# Patient Record
Sex: Female | Born: 1970
Health system: Southern US, Community
[De-identification: ages and names within clinical notes are randomized; demographics above are authoritative.]

## PROBLEM LIST (undated history)

## (undated) DIAGNOSIS — J45909 Unspecified asthma, uncomplicated: Secondary | ICD-10-CM

## (undated) DIAGNOSIS — I493 Ventricular premature depolarization: Secondary | ICD-10-CM

## (undated) DIAGNOSIS — G4733 Obstructive sleep apnea (adult) (pediatric): Principal | ICD-10-CM

## (undated) HISTORY — DX: Unspecified asthma, uncomplicated: J45.909

## (undated) HISTORY — DX: Obstructive sleep apnea (adult) (pediatric): G47.33

## (undated) HISTORY — DX: Ventricular premature depolarization: I49.3

---

## 2015-10-11 ENCOUNTER — Encounter: Payer: Self-pay | Admitting: Physician Assistant

## 2016-02-07 DIAGNOSIS — J45909 Unspecified asthma, uncomplicated: Secondary | ICD-10-CM | POA: Diagnosis not present

## 2016-07-17 ENCOUNTER — Ambulatory Visit (INDEPENDENT_AMBULATORY_CARE_PROVIDER_SITE_OTHER): Payer: BLUE CROSS/BLUE SHIELD | Admitting: Physician Assistant

## 2016-07-17 ENCOUNTER — Encounter: Payer: Self-pay | Admitting: Physician Assistant

## 2016-07-17 VITALS — BP 118/84 | HR 82 | Temp 97.9°F | Ht 62.0 in | Wt 290.0 lb

## 2016-07-17 DIAGNOSIS — M722 Plantar fascial fibromatosis: Secondary | ICD-10-CM | POA: Diagnosis not present

## 2016-07-17 DIAGNOSIS — J4530 Mild persistent asthma, uncomplicated: Secondary | ICD-10-CM

## 2016-07-17 DIAGNOSIS — E559 Vitamin D deficiency, unspecified: Secondary | ICD-10-CM | POA: Insufficient documentation

## 2016-07-17 DIAGNOSIS — L309 Dermatitis, unspecified: Secondary | ICD-10-CM | POA: Diagnosis not present

## 2016-07-17 DIAGNOSIS — IMO0001 Reserved for inherently not codable concepts without codable children: Secondary | ICD-10-CM | POA: Insufficient documentation

## 2016-07-17 DIAGNOSIS — M778 Other enthesopathies, not elsewhere classified: Secondary | ICD-10-CM

## 2016-07-17 DIAGNOSIS — D239 Other benign neoplasm of skin, unspecified: Secondary | ICD-10-CM

## 2016-07-17 DIAGNOSIS — D229 Melanocytic nevi, unspecified: Secondary | ICD-10-CM

## 2016-07-17 DIAGNOSIS — Z309 Encounter for contraceptive management, unspecified: Secondary | ICD-10-CM | POA: Diagnosis not present

## 2016-07-17 DIAGNOSIS — J45909 Unspecified asthma, uncomplicated: Secondary | ICD-10-CM | POA: Insufficient documentation

## 2016-07-17 DIAGNOSIS — Z6838 Body mass index (BMI) 38.0-38.9, adult: Secondary | ICD-10-CM | POA: Diagnosis not present

## 2016-07-17 DIAGNOSIS — M659 Synovitis and tenosynovitis, unspecified: Secondary | ICD-10-CM

## 2016-07-17 NOTE — Patient Instructions (Signed)
Asthma Attack Prevention While you may not be able to control the fact that you have asthma, you can take actions to prevent asthma attacks. The best way to prevent asthma attacks is to maintain good control of your asthma. You can achieve this by:  Taking your medicines as directed.  Avoiding things that can irritate your airways or make your asthma symptoms worse (asthma triggers).  Keeping track of how well your asthma is controlled and of any changes in your symptoms.  Responding quickly to worsening asthma symptoms (asthma attack).  Seeking emergency care when it is needed. WHAT ARE SOME WAYS TO PREVENT AN ASTHMA ATTACK? Have a Plan Work with your health care provider to create a written plan for managing and treating your asthma attacks (asthma action plan). This plan includes:  A list of your asthma triggers and how you can avoid them.  Information on when medicines should be taken and when their dosages should be changed.  The use of a device that measures how well your lungs are working (peak flow meter). Monitor Your Asthma Use your peak flow meter and record your results in a journal every day. A drop in your peak flow numbers on one or more days may indicate the start of an asthma attack. This can happen even before you start to feel symptoms. You can prevent an asthma attack from getting worse by following the steps in your asthma action plan. Avoid Asthma Triggers Work with your asthma health care provider to find out what your asthma triggers are. This can be done by:  Allergy testing.  Keeping a journal that notes when asthma attacks occur and the factors that may have contributed to them.  Determining if there are other medical conditions that are making your asthma worse. Once you have determined your asthma triggers, take steps to avoid them. This may include avoiding excessive or prolonged exposure to:  Dust. Have someone dust and vacuum your home for you once or  twice a week. Using a high-efficiency particulate arrestance (HEPA) vacuum is best.  Smoke. This includes campfire smoke, forest fire smoke, and secondhand smoke from tobacco products.  Pet dander. Avoid contact with animals that you know you are allergic to.  Allergens from trees, grasses or pollens. Avoid spending a lot of time outdoors when pollen counts are high, and on very windy days.  Very cold, dry, or humid air.  Mold.  Foods that contain high amounts of sulfites.  Strong odors.  Outdoor air pollutants, such as engine exhaust.  Indoor air pollutants, such as aerosol sprays and fumes from household cleaners.  Household pests, including dust mites and cockroaches, and pest droppings.  Certain medicines, including NSAIDs. Always talk to your health care provider before stopping or starting any new medicines. Medicines Take over-the-counter and prescription medicines only as told by your health care provider. Many asthma attacks can be prevented by carefully following your medicine schedule. Taking your medicines correctly is especially important when you cannot avoid certain asthma triggers. Act Quickly If an asthma attack does happen, acting quickly can decrease how severe it is and how long it lasts. Take these steps:   Pay attention to your symptoms. If you are coughing, wheezing, or having difficulty breathing, do not wait to see if your symptoms go away on their own. Follow your asthma action plan.  If you have followed your asthma action plan and your symptoms are not improving, call your health care provider or seek immediate medical care   at the nearest hospital. It is important to note how often you need to use your fast-acting rescue inhaler. If you are using your rescue inhaler more often, it may mean that your asthma is not under control. Adjusting your asthma treatment plan may help you to prevent future asthma attacks and help you to gain better control of your  condition. HOW CAN I PREVENT AN ASTHMA ATTACK WHEN I EXERCISE? Follow advice from your health care provider about whether you should use your fast-acting inhaler before exercising. Many people with asthma experience exercise-induced bronchoconstriction (EIB). This condition often worsens during vigorous exercise in cold, humid, or dry environments. Usually, people with EIB can stay very active by pre-treating with a fast-acting inhaler before exercising.   This information is not intended to replace advice given to you by your health care provider. Make sure you discuss any questions you have with your health care provider.   Document Released: 10/11/2009 Document Revised: 07/14/2015 Document Reviewed: 03/25/2015 Elsevier Interactive Patient Education 2016 Elsevier Inc.  

## 2016-07-17 NOTE — Progress Notes (Signed)
BP 118/84 (BP Location: Right Arm, Patient Position: Sitting, Cuff Size: Large)   Pulse 82   Temp 97.9 F (36.6 C) (Oral)   Wt 209 lb 9.6 oz (95.1 kg)   LMP 05/16/2016    Subjective:    Patient ID: Barbara Hinton, female    DOB: [birth year], 45 y.o.   MRN: [id number]  Barbara Hinton is a 45 y.o. female presenting on 07/17/2016 for Amenorrhea (Missed menstrual cycle the last 2 months. Took 2 home pregnancy test and they were negative ); Elbow Pain (right ); sore on nose; and Foot Swelling (heels and the side of her feet hurt )  HPI Patient here to be established as new patient at [facility].  This patient is known to me from [facility]. This patient has multiple medical complaints today. She also has multiple medical conditions that are reviewed today and updated in her chart. All of her medications are reviewed today also. She is most concerned about having 2 cycles that have been missed. When she missed 1 in July she did not take her birth control starting the next month. She should've had a cycle last week and did not have one. She has taken 2 home test and is negative. She states occasionally she has some lower right abdominal pain but no changes in her bowel bladder or urinary function. She is not had sexual intercourse since July. She is having night time sweats and disrupted sleep. Her mother was in her mid 63s when she went through menopause. She was placed on the birth control to control the ovarian cyst. And this has done well over the past 2 years. I've encouraged her to get back on this at this time to control cyst.  She has a lesion on her nose that started early summer and had a hard time healing. It is only just been in recent week or so that it has completely cleared. She does have a family history of skin cancer. She is concerned about this lesion and we will have dermatology evaluation of it.  She is also having a significant amount of  tenderness in the right medial epicondyle. 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 She has not been taking any anti-inflammatory for this.  She has a recurrence of her plantar fasciitis. We have discussed wearing structured shoes as much as possible. We've also discussed wearing as good issues as possible when she is at work. She does stand a lot at her job. Again she has not been taking any anti-inflammatory and will resume this. We have also discussed stretching both feet when possible. Next  All of her conditions and medications are reviewed today and we will send refills if needed.   Relevant past medical, surgical, family and social history reviewed and updated as indicated. Interim medical history since our last visit reviewed. Allergies and medications reviewed and updated.   Data reviewed from any sources in EPIC.  Review of Systems  Constitutional: Negative.  Negative for activity change, fatigue and fever.  HENT: Negative.   Eyes: Negative.   Respiratory: Negative.  Negative for cough.   Cardiovascular: Negative.  Negative for chest pain.  Gastrointestinal: Negative.  Negative for abdominal pain.  Endocrine: Negative.   Genitourinary: Positive for urgency. Negative for dysuria.  Musculoskeletal: Positive for arthralgias and joint swelling.  Skin: Negative.   Neurological: Negative.   Psychiatric/Behavioral: Negative.  Per HPI unless specifically indicated above  Social History   Social History  . Marital status: Married    Spouse name: N/A  . Number of children: N/A  . Years of education: N/A   Occupational History  . Not on file.   Social History Main Topics  . Smoking status: Never Smoker  . Smokeless tobacco: Never Used  . Alcohol use No  . Drug use: No  . Sexual activity: Not on file   Other Topics Concern  . Not on file   Social History Narrative  . No narrative on file    History reviewed. No pertinent  surgical history.  Family History  Problem Relation Age of Onset  . Diabetes Mother   . Diabetes Father   . Hypertension Father   . Hypertension Brother       Medication List       Accurate as of 07/17/16 10:29 AM. Always use your most recent med list.          albuterol 108 (90 Base) MCG/ACT inhaler Commonly known as:  PROVENTIL HFA;VENTOLIN HFA Inhale 2 puffs into the lungs every 6 (six) hours as needed for wheezing or shortness of breath.   diazepam 10 MG tablet Commonly known as:  VALIUM Take 10 mg by mouth every 6 (six) hours as needed for anxiety.   Fluticasone-Salmeterol 100-50 MCG/DOSE Aepb Commonly known as:  ADVAIR Inhale 1 puff into the lungs 2 (two) times daily.   montelukast 10 MG tablet Commonly known as:  SINGULAIR Take 10 mg by mouth at bedtime.   norethindrone-ethinyl estradiol 1/35 tablet Commonly known as:  ORTHO-NOVUM, NORTREL,CYCLAFEM Take 1 tablet by mouth daily.   Vitamin D (Ergocalciferol) 50000 units Caps capsule Commonly known as:  DRISDOL Take 50,000 Units by mouth every 7 (seven) days.          Objective:    BP 118/84 (BP Location: Right Arm, Patient Position: Sitting, Cuff Size: Large)   Pulse 82   Temp 97.9 F (36.6 C) (Oral)   Wt 209 lb 9.6 oz (95.1 kg)   LMP 05/16/2016   No Known Allergies Wt Readings from Last 3 Encounters:  07/17/16 209 lb 9.6 oz (95.1 kg)    Physical Exam  Constitutional: She is oriented to person, place, and time. She appears well-developed and well-nourished.  HENT:  Head: Normocephalic and atraumatic.  Eyes: Conjunctivae and EOM are normal. Pupils are equal, round, and reactive to light.  Neck: Normal range of motion. Neck supple.  Cardiovascular: Normal rate, regular rhythm, normal heart sounds and intact distal pulses.   Pulmonary/Chest: Effort normal and breath sounds normal.  Abdominal: Soft. Bowel sounds are normal.  Musculoskeletal:       Right elbow: She exhibits normal range of motion,  no swelling and no deformity. Tenderness found. Medial epicondyle tenderness noted.       Arms:      Right foot: There is decreased range of motion, tenderness and bony tenderness.       Left foot: There is tenderness and bony tenderness.       Feet:  Neurological: She is alert and oriented to person, place, and time. She has normal reflexes.  Skin: Skin is warm and dry. Lesion and rash noted. Rash is papular.     Raised pink edge with center umbilicus.  Present for 3 months. similar very small lesion on the right forehead.  Psychiatric: She has a normal mood and affect. Her behavior is normal. Judgment and thought  content normal.  Nursing note and vitals reviewed.   No results found for this or any previous visit.    Assessment & Plan:   1. Asthma, mild persistent, uncomplicated continue regular meds  2. Encounter for contraceptive management, unspecified encounter Restart birth control today in light of 2 neg pregnancy tests and history of ovarian cysts  3. Vitamin D deficiency Continue med  4. Eczematous dermatitis Continue med  5. Atypical nevus History of poor healing lesion on nose, raised pearly border - Ambulatory referral to Dermatology Dr. Marylou Mccoy, [place]  6. Plantar fasciitis Continue meds and stretching  7. Elbow tendonitis Continue meds and ice  Continue all other maintenance medications as listed above.  Follow up plan: Return in about 3 months (around 10/16/2016).  Terald Sleeper PA-C [facility] [address]  [place], [place] [address] [phone]   07/17/2016, 10:29 AM

## 2016-08-15 DIAGNOSIS — C44319 Basal cell carcinoma of skin of other parts of face: Secondary | ICD-10-CM | POA: Diagnosis not present

## 2016-08-15 DIAGNOSIS — L309 Dermatitis, unspecified: Secondary | ICD-10-CM | POA: Diagnosis not present

## 2016-08-15 DIAGNOSIS — C44311 Basal cell carcinoma of skin of nose: Secondary | ICD-10-CM | POA: Diagnosis not present

## 2016-10-03 ENCOUNTER — Other Ambulatory Visit: Payer: Self-pay | Admitting: Physician Assistant

## 2016-10-04 ENCOUNTER — Other Ambulatory Visit: Payer: Self-pay | Admitting: *Deleted

## 2016-10-10 ENCOUNTER — Telehealth: Payer: Self-pay | Admitting: *Deleted

## 2016-10-10 MED ORDER — PREDNISONE 10 MG (21) PO TBPK: ORAL_TABLET | ORAL | 0 refills | Status: DC

## 2016-10-10 MED ORDER — LEVOFLOXACIN 500 MG PO TABS: 500.0000 mg | ORAL_TABLET | Freq: Every day | ORAL | 0 refills | Status: DC

## 2016-10-10 MED ORDER — HYDROCODONE-HOMATROPINE 5-1.5 MG/5ML PO SYRP: 5.0000 mL | ORAL_SOLUTION | Freq: Four times a day (QID) | ORAL | 0 refills | Status: DC | PRN

## 2016-10-10 NOTE — Telephone Encounter (Signed)
sent 

## 2016-10-10 NOTE — Telephone Encounter (Signed)
Patient aware.

## 2016-10-10 NOTE — Telephone Encounter (Signed)
Patient has a cough with a lot of congestion. She has been using her nebulizer.

## 2016-11-12 DIAGNOSIS — R0789 Other chest pain: Secondary | ICD-10-CM | POA: Diagnosis not present

## 2016-11-12 DIAGNOSIS — I499 Cardiac arrhythmia, unspecified: Secondary | ICD-10-CM | POA: Diagnosis not present

## 2016-11-13 ENCOUNTER — Ambulatory Visit (INDEPENDENT_AMBULATORY_CARE_PROVIDER_SITE_OTHER): Payer: BLUE CROSS/BLUE SHIELD | Admitting: Physician Assistant

## 2016-11-13 ENCOUNTER — Encounter: Payer: Self-pay | Admitting: Physician Assistant

## 2016-11-13 VITALS — BP 138/93 | HR 110 | Temp 98.5°F | Ht 62.0 in | Wt 293.8 lb

## 2016-11-13 DIAGNOSIS — R5383 Other fatigue: Secondary | ICD-10-CM | POA: Diagnosis not present

## 2016-11-13 DIAGNOSIS — J209 Acute bronchitis, unspecified: Secondary | ICD-10-CM | POA: Insufficient documentation

## 2016-11-13 DIAGNOSIS — I472 Ventricular tachycardia: Secondary | ICD-10-CM | POA: Diagnosis not present

## 2016-11-13 DIAGNOSIS — I4729 Other ventricular tachycardia: Secondary | ICD-10-CM | POA: Insufficient documentation

## 2016-11-13 MED ORDER — METHYLPREDNISOLONE ACETATE 80 MG/ML IJ SUSP
80.0000 mg | Freq: Once | INTRAMUSCULAR | Status: AC
  Administered 2016-11-13: 80 mg via INTRAMUSCULAR

## 2016-11-13 MED ORDER — LEVOFLOXACIN 500 MG PO TABS: 500.0000 mg | ORAL_TABLET | Freq: Every day | ORAL | 0 refills | Status: DC

## 2016-11-13 MED ORDER — METOPROLOL SUCCINATE ER 25 MG PO TB24: 25.0000 mg | ORAL_TABLET | Freq: Every day | ORAL | 3 refills | Status: DC

## 2016-11-13 NOTE — Progress Notes (Addendum)
BP (!) 138/93   Pulse (!) 110   Temp 98.5 F (36.9 C) (Oral)   Ht 5\' 2"  (1.575 m)   Wt 293 lb 12.8 oz (133.3 kg)   BMI 53.74 kg/m    Subjective:    Patient ID: Barbara Hinton, female    DOB: 1971-04-26, 46 y.o.   MRN: [id number]  HPI: Barbara Hinton is a 46 y.o. female presenting on 11/13/2016 for Cough and Palpitations  Patient has had significant palpitations for the past 2 days. She was seen at the urgent care yesterday and did have palpitations but a normal EKG. She is continued with some today. She has had a history of asthma in the past. She has had a significant upper respiratory and bronchitis infection that has gone on for many weeks. On the room today she has severe coughing and tightness with her cough.  Relevant past medical, surgical, family and social history reviewed and updated as indicated. Allergies and medications reviewed and updated.  Past Medical History:  Diagnosis Date  . Asthma     History reviewed. No pertinent surgical history.  Review of Systems  Constitutional: Negative.  Negative for activity change, fatigue and fever.  HENT: Negative.   Eyes: Negative.   Respiratory: Negative.  Negative for cough.   Cardiovascular: Negative.  Negative for chest pain.  Gastrointestinal: Negative.  Negative for abdominal pain.  Endocrine: Negative.   Genitourinary: Negative.  Negative for dysuria.  Musculoskeletal: Negative.   Skin: Negative.   Neurological: Negative.     Allergies as of 11/13/2016   No Known Allergies     Medication List       Accurate as of 11/13/16 10:02 PM. Always use your most recent med list.          albuterol 108 (90 Base) MCG/ACT inhaler Commonly known as:  PROVENTIL HFA;VENTOLIN HFA Inhale 2 puffs into the lungs every 6 (six) hours as needed for wheezing or shortness of breath.   diazepam 10 MG tablet Commonly known as:  VALIUM Take 10 mg by mouth every 6 (six) hours as needed for anxiety.   Fluticasone-Salmeterol  100-50 MCG/DOSE Aepb Commonly known as:  ADVAIR Inhale 1 puff into the lungs 2 (two) times daily.   HYDROcodone-homatropine 5-1.5 MG/5ML syrup Commonly known as:  HYCODAN Take 5-10 mLs by mouth every 6 (six) hours as needed for cough.   levofloxacin 500 MG tablet Commonly known as:  LEVAQUIN Take 1 tablet (500 mg total) by mouth daily.   metoprolol succinate 25 MG 24 hr tablet Commonly known as:  TOPROL-XL Take 1 tablet (25 mg total) by mouth daily.   montelukast 10 MG tablet Commonly known as:  SINGULAIR Take 10 mg by mouth at bedtime.   NORTREL 7/7/7 0.5/0.75/1-35 MG-MCG tablet Generic drug:  norethindrone-ethinyl estradiol TAKE 1 TABLET BY MOUTH EVERY DAY   Vitamin D (Ergocalciferol) 50000 units Caps capsule Commonly known as:  DRISDOL Take 50,000 Units by mouth every 7 (seven) days.          Objective:    BP (!) 138/93   Pulse (!) 110   Temp 98.5 F (36.9 C) (Oral)   Ht 5\' 2"  (1.575 m)   Wt 293 lb 12.8 oz (133.3 kg)   BMI 53.74 kg/m   No Known Allergies  Physical Exam  Constitutional: She is oriented to person, place, and time. She appears well-developed and well-nourished.  HENT:  Head: Normocephalic and atraumatic.  Right Ear: There is drainage and tenderness.  Left Ear: There is drainage and tenderness.  Nose: Mucosal edema and rhinorrhea present. Right sinus exhibits maxillary sinus tenderness and frontal sinus tenderness. Left sinus exhibits maxillary sinus tenderness and frontal sinus tenderness.  Mouth/Throat: Oropharyngeal exudate and posterior oropharyngeal erythema present.  Eyes: Conjunctivae and EOM are normal. Pupils are equal, round, and reactive to light.  Neck: Normal range of motion. Neck supple.  Cardiovascular: Regular rhythm, normal heart sounds and intact distal pulses.  Tachycardia present.   Pulmonary/Chest: Effort normal. She has wheezes in the right upper field and the left upper field.  Abdominal: Soft. Bowel sounds are normal.    Neurological: She is alert and oriented to person, place, and time. She has normal reflexes.  Skin: Skin is warm and dry. No rash noted.  Psychiatric: She has a normal mood and affect. Her behavior is normal. Judgment and thought content normal.    No results found for this or any previous visit.    Assessment & Plan:   1. Paroxysmal ventricular tachycardia (HCC) - metoprolol succinate (TOPROL-XL) 25 MG 24 hr tablet; Take 1 tablet (25 mg total) by mouth daily.  Dispense: 30 tablet; Refill: 3 - Thyroid Panel With TSH - Ambulatory referral to Cardiology  2. Acute bronchitis, unspecified organism - levofloxacin (LEVAQUIN) 500 MG tablet; Take 1 tablet (500 mg total) by mouth daily.  Dispense: 10 tablet; Refill: 0 - methylPREDNISolone acetate (DEPO-MEDROL) injection 80 mg; Inject 1 mL (80 mg total) into the muscle once.  3. Fatigue, unspecified type - Thyroid Panel With TSH    Continue all other maintenance medications as listed above.  Follow up plan: Return in about 11 days (around 11/24/2016).  Orders Placed This Encounter  Procedures  . Thyroid Panel With TSH    Educational handout given for tachycardia  Terald Sleeper PA-C [facility] [address]  [place], [place] [address] [phone]   11/13/2016, 10:02 PM

## 2016-11-13 NOTE — Patient Instructions (Signed)
Supraventricular Tachycardia, Adult °Supraventricular tachycardia (SVT) is a kind of abnormal heartbeat. It makes your heart beat very fast and then beat at a normal speed. °A normal heart beats 60-100 times a minute. This condition can make your heart beat more than 150 times a minute. Times of having a fast heartbeat (episodes) can be scary, but they are usually not dangerous. They can lead to problems if: °· They happen often. °· They last a long time. °Symptoms of this condition include: °· A pounding heart. °· A feeling that your heart is skipping beats (palpitations). °· Weakness. °· Trouble getting enough air (shortness of breath). °· Pain or tightness in your chest. °· Feeling like you are going to pass out (light-headedness). °· Feeling worried or nervous (anxiety). °· Dizziness. °· Sweating. °· Feeling sick to your stomach (nausea). °· Passing out (fainting). °· Tiredness. °Sometimes, there are no symptoms. °Follow these instructions at home: °Stress  °· Avoid things that make you feel stressed. °· Find out what helps you feel less stressed. Try: °¨ Doing a relaxing activity, like yoga, meditation, or being out in nature. °¨ Listening to relaxing music. °¨ Doing relaxation techniques, like deep breathing. °¨ Taking steps to be healthy. These include getting lots of sleep, exercising, and eating a balanced diet. °¨ Talking with a mental health doctor. °Sleep  °· Try to get at least 7 hours of sleep each night. °Tobacco and nicotine  °· Do not use anything that has nicotine or tobacco, such as cigarettes and e-cigarettes. If you need help quitting, ask your doctor. °Alcohol  °· If alcohol gives you a fast heartbeat, do not drink alcohol. °· If alcohol does not seem to give you a fast heartbeat, limit your alcohol. For nonpregnant women, this means no more than 1 drink a day. For men, this means no more than 2 drinks a day. "One drink" means one of these: °¨ 12 oz of beer. °¨ 5 oz of wine. °¨ 1½ oz of hard  liquor. °Caffeine  °· If caffeine gives you a fast heartbeat, do not eat, drink, or use anything with caffeine in it. °· If caffeine does not seem to give you a fast heartbeat, limit how much caffeine you eat, drink, or use. °Stimulant drugs  °· Do not use stimulant drugs. These are drugs like cocaine or methamphetamine. If you need help quitting, ask your doctor. °General instructions  °· Stay at a healthy weight. °· Exercise regularly. Ask your doctor to suggest some good activities for you. Try one of these options: °¨ 150 minutes a week of gentle exercise, like walking or yoga. °¨ 75 minutes a week of exercise that is very active, like running or swimming. °¨ A combination of gentle exercise and very active exercise. °· Do home treatments to slow down your heartbeat as told by your doctor. °· Take over-the-counter and prescription medicines only as told by your doctor. °Contact a doctor if: °· You have a fast heartbeat more often. °· Times of having a fast heartbeat last longer than before. °· Your home treatments to slow down your heartbeat do not help. °· You have new symptoms. °Get help right away if: °· You have chest pain. °· Your symptoms get worse. °· You have trouble breathing. °· Your heart beats very fast for more than 20 minutes. °· You pass out (faint). °These symptoms may be an emergency. Do not wait to see if the symptoms will go away. Get medical help right away. Call your   local emergency services (911 in the [place] Do not drive yourself to the hospital. °This information is not intended to replace advice given to you by your health care provider. Make sure you discuss any questions you have with your health care provider. °Document Released: 10/23/2005 Document Revised: 06/29/2016 Document Reviewed: 06/29/2016 °Elsevier Interactive Patient Education © 2017 Elsevier Inc. ° °

## 2016-11-14 LAB — THYROID PANEL WITH TSH
FREE THYROXINE INDEX: 1.8 (ref 1.2–4.9)
T3 Uptake Ratio: 22 % — ABNORMAL LOW (ref 24–39)
T4, Total: 8.1 ug/dL (ref 4.5–12.0)
TSH: 2.46 u[IU]/mL (ref 0.450–4.500)

## 2016-11-14 NOTE — Addendum Note (Signed)
Addended by: Terald Sleeper on: 11/14/2016 09:29 AM   Modules accepted: Orders

## 2016-11-28 ENCOUNTER — Ambulatory Visit (INDEPENDENT_AMBULATORY_CARE_PROVIDER_SITE_OTHER): Payer: BLUE CROSS/BLUE SHIELD | Admitting: Physician Assistant

## 2016-11-28 ENCOUNTER — Encounter: Payer: Self-pay | Admitting: Physician Assistant

## 2016-11-28 VITALS — BP 117/77 | HR 87 | Temp 98.1°F | Ht 62.0 in | Wt 287.0 lb

## 2016-11-28 DIAGNOSIS — I4729 Other ventricular tachycardia: Secondary | ICD-10-CM

## 2016-11-28 DIAGNOSIS — I472 Ventricular tachycardia: Secondary | ICD-10-CM | POA: Diagnosis not present

## 2016-11-28 NOTE — Patient Instructions (Addendum)
Breakfast: eggs 2-3 Or greek yogurt low fat DANNON  Lunch; sara lee delightful bread 2 slices 4 ounces chick, turk, roast beef.  1 slice Thin sliced cheese sargento. Mustard ok 1 piece of fruit  Supper: 6 ounces lean meat 2 cups raw/cooked veg or 1 cup pintos, corn lima  3 snacks 100 calories or less

## 2016-11-28 NOTE — Progress Notes (Signed)
BP 117/77   Pulse 87   Temp 98.1 F (36.7 C) (Oral)   Ht 5\' 2"  (1.575 m)   Wt 287 lb (130.2 kg)   BMI 52.49 kg/m    Subjective:    Patient ID: Barbara Hinton, female    DOB: 02-Nov-1971, 46 y.o.   MRN: [id number]  HPI: Barbara Hinton is a 46 y.o. female presenting on 11/28/2016 for 10 day recheck (Follow up on tachycardia and cough )  This patient comes in for periodic recheck on medications and conditions. All medications are reviewed today. There are no reports of any problems with the medications. All of the medical conditions are reviewed and updated.  Lab work is reviewed and will be ordered as medically necessary. There are no new problems reported with today's visit. She is feeling tremendously better with the tachycardia. Her bronchitis has completely resolved. She has lost 6 pounds by reducing sodium in her diet. She is off of caffeine and is working on getting rid of all the soda.  Past Medical History:  Diagnosis Date  . Asthma    Relevant past medical, surgical, family and social history reviewed and updated as indicated. Interim medical history since our last visit reviewed. Allergies and medications reviewed and updated. DATA REVIEWED: CHART IN EPIC  Social History   Social History  . Marital status: Married    Spouse name: N/A  . Number of children: N/A  . Years of education: N/A   Occupational History  . Not on file.   Social History Main Topics  . Smoking status: Never Smoker  . Smokeless tobacco: Never Used  . Alcohol use No  . Drug use: No  . Sexual activity: Not on file   Other Topics Concern  . Not on file   Social History Narrative  . No narrative on file    History reviewed. No pertinent surgical history.  Family History  Problem Relation Age of Onset  . Diabetes Mother   . Diabetes Father   . Hypertension Father   . Hypertension Brother     Review of Systems  Constitutional: Negative.  Negative for activity change, fatigue and  fever.  HENT: Negative.   Eyes: Negative.   Respiratory: Negative.  Negative for cough.   Cardiovascular: Negative.  Negative for chest pain.  Gastrointestinal: Negative.  Negative for abdominal pain.  Endocrine: Negative.   Genitourinary: Negative.  Negative for dysuria.  Musculoskeletal: Negative.   Skin: Negative.   Neurological: Negative.     Allergies as of 11/28/2016   No Known Allergies     Medication List       Accurate as of 11/28/16  2:46 PM. Always use your most recent med list.          albuterol 108 (90 Base) MCG/ACT inhaler Commonly known as:  PROVENTIL HFA;VENTOLIN HFA Inhale 2 puffs into the lungs every 6 (six) hours as needed for wheezing or shortness of breath.   diazepam 10 MG tablet Commonly known as:  VALIUM Take 10 mg by mouth every 6 (six) hours as needed for anxiety.   Fluticasone-Salmeterol 100-50 MCG/DOSE Aepb Commonly known as:  ADVAIR Inhale 1 puff into the lungs 2 (two) times daily.   HYDROcodone-homatropine 5-1.5 MG/5ML syrup Commonly known as:  HYCODAN Take 5-10 mLs by mouth every 6 (six) hours as needed for cough.   montelukast 10 MG tablet Commonly known as:  SINGULAIR Take 10 mg by mouth at bedtime.   NORTREL 7/7/7 0.5/0.75/1-35 MG-MCG  tablet Generic drug:  norethindrone-ethinyl estradiol TAKE 1 TABLET BY MOUTH EVERY DAY   Vitamin D (Ergocalciferol) 50000 units Caps capsule Commonly known as:  DRISDOL Take 50,000 Units by mouth every 7 (seven) days.          Objective:    BP 117/77   Pulse 87   Temp 98.1 F (36.7 C) (Oral)   Ht 5\' 2"  (1.575 m)   Wt 287 lb (130.2 kg)   BMI 52.49 kg/m   No Known Allergies  Wt Readings from Last 3 Encounters:  11/28/16 287 lb (130.2 kg)  11/13/16 293 lb 12.8 oz (133.3 kg)  07/17/16 290 lb (131.5 kg)    Physical Exam  Constitutional: She is oriented to person, place, and time. She appears well-developed and well-nourished.  HENT:  Head: Normocephalic and atraumatic.  Eyes:  Conjunctivae and EOM are normal. Pupils are equal, round, and reactive to light.  Cardiovascular: Normal rate, regular rhythm, normal heart sounds and intact distal pulses.   Pulmonary/Chest: Effort normal and breath sounds normal.  Abdominal: Soft. Bowel sounds are normal.  Neurological: She is alert and oriented to person, place, and time. She has normal reflexes.  Skin: Skin is warm and dry. No rash noted.  Psychiatric: She has a normal mood and affect. Her behavior is normal. Judgment and thought content normal.    Results for orders placed or performed in visit on 11/13/16  Thyroid Panel With TSH  Result Value Ref Range   TSH 2.460 0.450 - 4.500 uIU/mL   T4, Total 8.1 4.5 - 12.0 ug/dL   T3 Uptake Ratio 22 (L) 24 - 39 %   Free Thyroxine Index 1.8 1.2 - 4.9      Assessment & Plan:   1. Paroxysmal ventricular tachycardia (HCC) Stop metoprolol at this time. Continue caffeine cessation  2. Obesity, morbid ([place]) Discussed 1500-calorie balanced diet   Continue all other maintenance medications as listed above.  Follow up plan: Return if symptoms worsen or fail to improve.  No orders of the defined types were placed in this encounter.   Educational handout given for 1500 calorie diet  Terald Sleeper PA-C [facility] [address]  [place], [place] [address] [phone]   11/28/2016, 2:46 PM

## 2016-12-11 ENCOUNTER — Other Ambulatory Visit: Payer: Self-pay | Admitting: Physician Assistant

## 2016-12-25 ENCOUNTER — Telehealth: Payer: Self-pay | Admitting: *Deleted

## 2016-12-25 NOTE — Telephone Encounter (Signed)
Patient has received referral for cardiology but she had thought you had said it was ok to wait. Is it ok for patient to wait to see cardiology or she keep appointment?

## 2016-12-25 NOTE — Telephone Encounter (Signed)
Ok to wait, please cancel the appointment

## 2016-12-26 NOTE — Telephone Encounter (Signed)
Patient aware that appointment may be cancelled.

## 2016-12-29 ENCOUNTER — Ambulatory Visit: Payer: Self-pay | Admitting: Cardiovascular Disease

## 2017-01-08 DIAGNOSIS — R509 Fever, unspecified: Secondary | ICD-10-CM | POA: Diagnosis not present

## 2017-01-08 DIAGNOSIS — R6889 Other general symptoms and signs: Secondary | ICD-10-CM | POA: Diagnosis not present

## 2017-02-14 DIAGNOSIS — R0602 Shortness of breath: Secondary | ICD-10-CM | POA: Diagnosis not present

## 2017-02-14 DIAGNOSIS — R110 Nausea: Secondary | ICD-10-CM | POA: Diagnosis not present

## 2017-02-14 DIAGNOSIS — M542 Cervicalgia: Secondary | ICD-10-CM | POA: Diagnosis not present

## 2017-02-14 DIAGNOSIS — R079 Chest pain, unspecified: Secondary | ICD-10-CM | POA: Diagnosis not present

## 2017-02-14 DIAGNOSIS — J45909 Unspecified asthma, uncomplicated: Secondary | ICD-10-CM | POA: Diagnosis not present

## 2017-02-14 DIAGNOSIS — Z7951 Long term (current) use of inhaled steroids: Secondary | ICD-10-CM | POA: Diagnosis not present

## 2017-02-21 ENCOUNTER — Other Ambulatory Visit: Payer: Self-pay | Admitting: *Deleted

## 2017-02-21 DIAGNOSIS — I498 Other specified cardiac arrhythmias: Secondary | ICD-10-CM

## 2017-02-21 DIAGNOSIS — R079 Chest pain, unspecified: Secondary | ICD-10-CM

## 2017-03-07 ENCOUNTER — Other Ambulatory Visit: Payer: Self-pay | Admitting: *Deleted

## 2017-03-07 DIAGNOSIS — R002 Palpitations: Secondary | ICD-10-CM

## 2017-03-07 NOTE — Progress Notes (Signed)
Patient is continuing to have palpitations and per Particia Nearing PA-C have patient come in and have a [facility] [employer] monitor placed.

## 2017-03-08 ENCOUNTER — Ambulatory Visit (INDEPENDENT_AMBULATORY_CARE_PROVIDER_SITE_OTHER): Payer: BLUE CROSS/BLUE SHIELD | Admitting: *Deleted

## 2017-03-08 DIAGNOSIS — R002 Palpitations: Secondary | ICD-10-CM

## 2017-03-08 NOTE — Progress Notes (Signed)
[place] [id number] Serial number [id number]

## 2017-03-19 DIAGNOSIS — R079 Chest pain, unspecified: Secondary | ICD-10-CM | POA: Insufficient documentation

## 2017-03-20 NOTE — Progress Notes (Signed)
Cardiology Office Note    Date:  03/21/2017   ID:  Barbara, Hinton 26-Nov-1970, MRN [id number]  PCP:  Terald Sleeper, PA-C  Cardiologist: Sinclair Grooms, MD   Chief Complaint  Patient presents with  . Chest Pain  . Shortness of Breath    History of Present Illness:  Barbara Hinton is a 46 y.o. female referred by Particia Nearing, PA-C for evaluation of chest discomfort and palpitations.  Barbara Hinton is a very pleasant female with a family history (maternal and paternal grandfathers) premature CAD. 6-8 weeks ago she was experiencing episodes of left arm shoulder and jaw discomfort that were coming in waves lasting a minute and resolving. The discomfort was not precipitated by activity. There was no nausea or vomiting or diaphoresis.  Additionally, she has had episodes of palpitation. These occur at night when she tries to fall off to sleep. They occur at other times and are random.  Other complaints include loud snoring and restless sleep. She awakens multiple times at night to go to bathroom. She feels tired and sleepy and has excessive daytime sleepiness. She occasionally has lower extremity swelling. No history of pulmonary emboli.  Past Medical History:  Diagnosis Date  . Asthma     No past surgical history on file.  Current Medications: Outpatient Medications Prior to Visit  Medication Sig Dispense Refill  . albuterol (PROVENTIL HFA;VENTOLIN HFA) 108 (90 Base) MCG/ACT inhaler Inhale 2 puffs into the lungs every 6 (six) hours as needed for wheezing or shortness of breath.    . Fluticasone-Salmeterol (ADVAIR) 100-50 MCG/DOSE AEPB Inhale 1 puff into the lungs 2 (two) times daily.    . montelukast (SINGULAIR) 10 MG tablet Take 10 mg by mouth at bedtime.    Marland Kitchen NORTREL 7/7/7 0.5/0.75/1-35 MG-MCG tablet TAKE 1 TABLET BY MOUTH EVERY DAY 84 tablet 3  . Vitamin D, Ergocalciferol, (DRISDOL) 50000 units CAPS capsule TAKE ONE CAPSULE BY MOUTH EVERY WEEK 12 capsule 3  .  HYDROcodone-homatropine (HYCODAN) 5-1.5 MG/5ML syrup Take 5-10 mLs by mouth every 6 (six) hours as needed for cough. 240 mL 0  . diazepam (VALIUM) 10 MG tablet Take 10 mg by mouth every 6 (six) hours as needed for anxiety.     No facility-administered medications prior to visit.      Allergies:   Patient has no known allergies.   Social History   Social History  . Marital status: Married    Spouse name: N/A  . Number of children: N/A  . Years of education: N/A   Social History Main Topics  . Smoking status: Passive Smoke Exposure - Never Smoker  . Smokeless tobacco: Never Used  . Alcohol use No  . Drug use: No  . Sexual activity: Not Asked   Other Topics Concern  . None   Social History Narrative  . None     Family History:  The patient's family history includes Diabetes in her father and mother; Heart attack in her maternal grandfather and paternal grandfather; Hypertension in her brother and father.   ROS:   Please see the history of present illness.    Cough, dyspnea, diarrhea, back pain, wheezing, and snoring as mentioned above. Excessive daytime sleepiness.  All other systems reviewed and are negative.   PHYSICAL EXAM:   VS:  BP 116/88 (BP Location: Right Arm)   Pulse 89   Ht 5\' 2"  (1.575 m)   Wt 286 lb 1.9 oz (129.8 kg)   BMI  52.33 kg/m    GEN: Well nourished, well developed, in no acute distress . Marked obesity HEENT: normal  Neck: no JVD, carotid bruits, or masses Cardiac: RRR; no murmurs, rubs, or gallops,no edema  Respiratory:  clear to auscultation bilaterally, normal work of breathing GI: soft, nontender, nondistended, + BS MS: no deformity or atrophy  Skin: warm and dry, no rash Neuro:  Alert and Oriented x 3, Strength and sensation are intact Psych: euthymic mood, full affect  Wt Readings from Last 3 Encounters:  03/21/17 286 lb 1.9 oz (129.8 kg)  11/28/16 287 lb (130.2 kg)  11/13/16 293 lb 12.8 oz (133.3 kg)      Studies/Labs Reviewed:    EKG:  EKG  From January 2018 reveals sinus rhythm and was normal.  Recent Labs: 11/13/2016: TSH 2.460   Lipid Panel No results found for: CHOL, TRIG, HDL, CHOLHDL, VLDL, LDLCALC, LDLDIRECT  Additional studies/ records that were reviewed today include:  She is currently wearing a 30 day monitor placed by Mrs. Ronnald Ramp.    ASSESSMENT:    1. Snoring   2. Palpitations   3. Chest pain, unspecified type   4. Paroxysmal ventricular tachycardia (HCC)   5. Obesity, morbid ([place])      PLAN:  In order of problems listed above:  1. Severe sleep apnea could explain all of the patient's symptoms. She will be scheduled for sleep study. 2. At this point, the palpitations sound like PACs or PVCs. We will await the result of the 30 day monitor. An echocardiogram is going to be done to identify the cardiac substrate that might be provocative for arrhythmia. 3. Chest pain was nonspecific and has completely resolved. I do not believe further workup is necessary at this time. 2-D Doppler echocardiogram to rule out pulmonary hypertension and the potential setting of sleep apnea. 4. This was a pre-existing diagnosis on the patient's chart and I am unsure of its relevance of for the time being and we will allow her to remain. Echocardiogram to rule out structural abnormalities that may provoke ventricular arrhythmia. On the next visit, if I'm unable to identify definite strips or occurrences of ventricular tachycardia, this diagnosis will be removed. 5. This is likely her greatest risk factor. Likely leading to severe sleep apnea.  Very pleasant young female without evidence of obstructive coronary disease or other major complaints. She has had palpitations and transient arm and neck discomfort. On my evaluation the major concern I have is that she has possible severe sleep apnea that could be the root of all her cardiac complaints. She needs to have a sleep study done as soon as possible  Greater than 50% of  this time was spent in counseling concerning obesity, sleep hygiene, and potential impact of sleep apnea.  Medication Adjustments/Labs and Tests Ordered: Current medicines are reviewed at length with the patient today.  Concerns regarding medicines are outlined above.  Medication changes, Labs and Tests ordered today are listed in the Patient Instructions below. There are no Patient Instructions on file for this visit.   Signed, Sinclair Grooms, MD  03/21/2017 9:53 AM    [facility] [place], [place], [place]  [address] Phone: [phone]; Fax: [phone]

## 2017-03-21 ENCOUNTER — Ambulatory Visit (INDEPENDENT_AMBULATORY_CARE_PROVIDER_SITE_OTHER): Payer: BLUE CROSS/BLUE SHIELD | Admitting: Interventional Cardiology

## 2017-03-21 ENCOUNTER — Ambulatory Visit (HOSPITAL_COMMUNITY): Payer: BLUE CROSS/BLUE SHIELD | Attending: Cardiovascular Disease

## 2017-03-21 ENCOUNTER — Other Ambulatory Visit: Payer: Self-pay

## 2017-03-21 ENCOUNTER — Encounter: Payer: Self-pay | Admitting: Interventional Cardiology

## 2017-03-21 VITALS — BP 116/88 | HR 89 | Ht 62.0 in | Wt 286.1 lb

## 2017-03-21 DIAGNOSIS — I4729 Other ventricular tachycardia: Secondary | ICD-10-CM

## 2017-03-21 DIAGNOSIS — I472 Ventricular tachycardia: Secondary | ICD-10-CM | POA: Diagnosis not present

## 2017-03-21 DIAGNOSIS — R0683 Snoring: Secondary | ICD-10-CM | POA: Diagnosis not present

## 2017-03-21 DIAGNOSIS — R079 Chest pain, unspecified: Secondary | ICD-10-CM | POA: Insufficient documentation

## 2017-03-21 DIAGNOSIS — R002 Palpitations: Secondary | ICD-10-CM

## 2017-03-21 LAB — ECHOCARDIOGRAM COMPLETE
Height: 62 in
WEIGHTICAEL: 4577.92 [oz_av]

## 2017-03-21 NOTE — Patient Instructions (Signed)
Medication Instructions:  None  Labwork: None  Testing/Procedures: Your physician has requested that you have an echocardiogram. Echocardiography is a painless test that uses sound waves to create images of your heart. It provides your doctor with information about the size and shape of your heart and how well your heart's chambers and valves are working. This procedure takes approximately one hour. There are no restrictions for this procedure.  Your physician has recommended that you have a sleep study. This test records several body functions during sleep, including: brain activity, eye movement, oxygen and carbon dioxide blood levels, heart rate and rhythm, breathing rate and rhythm, the flow of air through your mouth and nose, snoring, body muscle movements, and chest and belly movement.   Follow-Up: Your physician recommends that you schedule a follow-up appointment in: 6 weeks with Dr. Tamala Julian.    Any Other Special Instructions Will Be Listed Below (If Applicable).     If you need a refill on your cardiac medications before your next appointment, please call your pharmacy.

## 2017-03-23 ENCOUNTER — Telehealth: Payer: Self-pay | Admitting: *Deleted

## 2017-03-23 NOTE — Telephone Encounter (Signed)
Informed patient of upcoming sleep study and patient understanding was verbalized. Patient understands her sleep study will be done at [facility] sleep lab. Patient understands she will receive a sleep packet in a week or so. Patient understands to call if she does not receive the sleep packet in a timely manner.  Patient understands her sleep study is scheduled for Tuesday May 22 2017. Patient agrees with treatment and thanked me for call

## 2017-05-06 NOTE — Progress Notes (Signed)
Cardiology Office Note    Date:  05/07/2017   ID:  Hinton, Barbara 11-11-1970, MRN [id number]  PCP:  Terald Sleeper, PA-C  Cardiologist: Sinclair Grooms, MD   Chief Complaint  Patient presents with  . Palpitations    New-onset    History of Present Illness:  Barbara Hinton is a 46 y.o. female for evaluation of chest pain , palpitations, and sleep apnea.  Since the initial office evaluation the patient has been set up for multiple tests. She should have the sleep study. Echocardiogram is demonstrated a structurally normal heart is outlined below. A 30 day monitor demonstrates a high correlation between isolated uniform PVCs and her complaint of heart fluttering.  Since cutting back significantly on caffeine, ""fluttering"has significantly diminished in severity.  Past Medical History:  Diagnosis Date  . Asthma     No past surgical history on file.  Current Medications: Outpatient Medications Prior to Visit  Medication Sig Dispense Refill  . albuterol (PROVENTIL HFA;VENTOLIN HFA) 108 (90 Base) MCG/ACT inhaler Inhale 2 puffs into the lungs every 6 (six) hours as needed for wheezing or shortness of breath.    Marland Kitchen aspirin 81 MG tablet Take 81 mg by mouth 2 (two) times a week.    . Fluticasone-Salmeterol (ADVAIR) 100-50 MCG/DOSE AEPB Inhale 1 puff into the lungs 2 (two) times daily.    . montelukast (SINGULAIR) 10 MG tablet Take 10 mg by mouth at bedtime.    Marland Kitchen NORTREL 7/7/7 0.5/0.75/1-35 MG-MCG tablet TAKE 1 TABLET BY MOUTH EVERY DAY 84 tablet 3  . Vitamin D, Ergocalciferol, (DRISDOL) 50000 units CAPS capsule TAKE ONE CAPSULE BY MOUTH EVERY WEEK 12 capsule 3   No facility-administered medications prior to visit.      Allergies:   Patient has no known allergies.   Social History   Social History  . Marital status: Married    Spouse name: N/A  . Number of children: N/A  . Years of education: N/A   Social History Main Topics  . Smoking status: Passive Smoke Exposure  - Never Smoker  . Smokeless tobacco: Never Used  . Alcohol use No  . Drug use: No  . Sexual activity: Not Asked   Other Topics Concern  . None   Social History Narrative  . None     Family History:  The patient's family history includes Diabetes in her father and mother; Heart attack in her maternal grandfather and paternal grandfather; Hypertension in her brother and father.   ROS:   Please see the history of present illness.    Anxiety with some headaches.  All other systems reviewed and are negative.   PHYSICAL EXAM:   VS:  BP 126/78 (BP Location: Left Arm)   Pulse 96   Ht 5' (1.524 m)   Wt 295 lb (133.8 kg)   BMI 57.61 kg/m    GEN: Well nourished, well developed, in no acute distress . Morbidly obese. HEENT: normal  Neck: no JVD, carotid bruits, or masses Cardiac: RRR; no murmurs, rubs, or gallops,no edema  Respiratory:  clear to auscultation bilaterally, normal work of breathing GI: soft, nontender, nondistended, + BS MS: no deformity or atrophy  Skin: warm and dry, no rash Neuro:  Alert and Oriented x 3, Strength and sensation are intact Psych: euthymic mood, full affect  Wt Readings from Last 3 Encounters:  05/07/17 295 lb (133.8 kg)  03/21/17 286 lb 1.9 oz (129.8 kg)  11/28/16 287 lb (130.2 kg)  Studies/Labs Reviewed:   EKG:  EKG  none  Recent Labs: 11/13/2016: TSH 2.460   Lipid Panel No results found for: CHOL, TRIG, HDL, CHOLHDL, VLDL, LDLCALC, LDLDIRECT  Additional studies/ records that were reviewed today include:  ECHOCARDIOGRAM 2018 Study Conclusions  - Left ventricle: The cavity size was normal. Systolic function was   normal. The estimated ejection fraction was in the range of 60%   to 65%. Wall motion was normal; there were no regional wall   motion abnormalities. Left ventricular diastolic function   parameters were normal. - Left atrium: The atrium was mildly dilated. - Atrial septum: No defect or patent foramen ovale was  identified.  30 day Holter monitor/event monitor 03/08/17-04/06/17: Normal sinus rhythm with isolated PVCs by Glendell Docker a with fluttering.  ASSESSMENT:    1. Premature ventricular contractions   2. Palpitations   3. Snoring   4. Obesity, morbid ([place])      PLAN:  In order of problems listed above:  1. The monitor performed between May 3 and June 1 was reviewed today in the office and demonstrates isolated PVCs that are uniform. These premature beats Carlie with her complaint of fluttering. There was no evidence of tachycardia or multifocality. Since decreasing caffeine, palpitations have significantly improved. I recommend no therapy now especially given the structurally normal heart and his been identified. Clinical observation for the time being. 2. Palpitations correlate with isolated PVCs. 3. Likely has sleep apnea.. Still awaiting results of the sleep study which is yet to be done.   Clinical observation. No change in the current regimen. A stain from caffeine.    Medication Adjustments/Labs and Tests Ordered: Current medicines are reviewed at length with the patient today.  Concerns regarding medicines are outlined above.  Medication changes, Labs and Tests ordered today are listed in the Patient Instructions below. Patient Instructions  Medication Instructions:  None  Labwork: None  Testing/Procedures: None  Follow-Up: Your physician recommends that you schedule a follow-up appointment as needed with Dr. Tamala Julian.    Any Other Special Instructions Will Be Listed Below (If Applicable).     If you need a refill on your cardiac medications before your next appointment, please call your pharmacy.      Signed, Sinclair Grooms, MD  05/07/2017 5:04 PM    [facility] [place], [place], [place]  [address] Phone: [phone]; Fax: [phone]

## 2017-05-07 ENCOUNTER — Ambulatory Visit (INDEPENDENT_AMBULATORY_CARE_PROVIDER_SITE_OTHER): Payer: BLUE CROSS/BLUE SHIELD | Admitting: Interventional Cardiology

## 2017-05-07 ENCOUNTER — Telehealth: Payer: Self-pay | Admitting: Physician Assistant

## 2017-05-07 ENCOUNTER — Encounter: Payer: Self-pay | Admitting: Interventional Cardiology

## 2017-05-07 VITALS — BP 126/78 | HR 96 | Ht 60.0 in | Wt 295.0 lb

## 2017-05-07 DIAGNOSIS — R0683 Snoring: Secondary | ICD-10-CM | POA: Diagnosis not present

## 2017-05-07 DIAGNOSIS — R002 Palpitations: Secondary | ICD-10-CM

## 2017-05-07 DIAGNOSIS — I493 Ventricular premature depolarization: Secondary | ICD-10-CM

## 2017-05-07 NOTE — Patient Instructions (Signed)
Medication Instructions:  None  Labwork: None  Testing/Procedures: None  Follow-Up: Your physician recommends that you schedule a follow-up appointment as needed with Dr. Smith.    Any Other Special Instructions Will Be Listed Below (If Applicable).     If you need a refill on your cardiac medications before your next appointment, please call your pharmacy.   

## 2017-05-07 NOTE — Telephone Encounter (Signed)
Barbara Hinton had to print heart monitor off the website and I faxed to dr Tamala Julian

## 2017-05-22 ENCOUNTER — Ambulatory Visit (HOSPITAL_BASED_OUTPATIENT_CLINIC_OR_DEPARTMENT_OTHER): Payer: BLUE CROSS/BLUE SHIELD | Attending: Interventional Cardiology | Admitting: Cardiology

## 2017-05-22 VITALS — Ht 60.0 in | Wt 285.0 lb

## 2017-05-22 DIAGNOSIS — R5383 Other fatigue: Secondary | ICD-10-CM | POA: Diagnosis not present

## 2017-05-22 DIAGNOSIS — G4733 Obstructive sleep apnea (adult) (pediatric): Secondary | ICD-10-CM | POA: Insufficient documentation

## 2017-05-22 DIAGNOSIS — Z6841 Body Mass Index (BMI) 40.0 and over, adult: Secondary | ICD-10-CM | POA: Insufficient documentation

## 2017-05-22 DIAGNOSIS — G4736 Sleep related hypoventilation in conditions classified elsewhere: Secondary | ICD-10-CM | POA: Diagnosis not present

## 2017-05-22 DIAGNOSIS — E669 Obesity, unspecified: Secondary | ICD-10-CM | POA: Insufficient documentation

## 2017-05-22 DIAGNOSIS — R0683 Snoring: Secondary | ICD-10-CM

## 2017-05-24 NOTE — Procedures (Signed)
   Patient Name: Barbara Hinton, Barbara Hinton Date: 05/22/2017 Gender: Female D.O.B: 08-30-1971 Age (years): 45 Referring Provider: Daneen Schick Height (inches): 40 Interpreting Physician: Fransico Him MD, ABSM Weight (lbs): 285 RPSGT: Carolin Coy BMI: 56 MRN: [id number] Neck Size: 14.50  CLINICAL INFORMATION Sleep Study Type: NPSG  Indication for sleep study: Fatigue, Obesity, Snoring  Epworth Sleepiness Score: 16  SLEEP STUDY TECHNIQUE As per the AASM Manual for the Scoring of Sleep and Associated Events v2.3 (April 2016) with a hypopnea requiring 4% desaturations.  The channels recorded and monitored were frontal, central and occipital EEG, electrooculogram (EOG), submentalis EMG (chin), nasal and oral airflow, thoracic and abdominal wall motion, anterior tibialis EMG, snore microphone, electrocardiogram, and pulse oximetry.  MEDICATIONS Medications self-administered by patient taken the night of the study : N/A  SLEEP ARCHITECTURE The study was initiated at 10:42:21 PM and ended at 4:59:23 AM.  Sleep onset time was 21.9 minutes and the sleep efficiency was 87.7%. The total sleep time was 330.7 minutes.  Stage REM latency was 86.5 minutes.  The patient spent 8.32% of the night in stage N1 sleep, 74.90% in stage N2 sleep, 0.60% in stage N3 and 16.18% in REM.  Alpha intrusion was absent.  Supine sleep was 90.27%.  RESPIRATORY PARAMETERS The overall apnea/hypopnea index (AHI) was 9.8 per hour. There were 7 total apneas, including 6 obstructive, 1 central and 0 mixed apneas. There were 47 hypopneas and 15 RERAs.  The AHI during Stage REM sleep was 53.8 per hour.  AHI while supine was 10.1 per hour.  The mean oxygen saturation was 92.19%. The minimum SpO2 during sleep was 81.00%.  Moderate snoring was noted during this study.  CARDIAC DATA The 2 lead EKG demonstrated sinus rhythm. The mean heart rate was 73.03 beats per minute. Other EKG findings include:  None.  LEG MOVEMENT DATA The total PLMS were 13 with a resulting PLMS index of 2.36. Associated arousal with leg movement index was 0.2 .  IMPRESSIONS - Mild obstructive sleep apnea occurred during this study (AHI = 9.8/h) but severe apnea in REM sleep (AHI=53.8/hr) - No significant central sleep apnea occurred during this study (CAI = 0.2/h). - Mild oxygen desaturation was noted during this study (Min O2 = 81.00%). - The patient snored with Moderate snoring volume. - No cardiac abnormalities were noted during this study. - Clinically significant periodic limb movements did not occur during sleep. No significant associated arousals.  DIAGNOSIS - Obstructive Sleep Apnea (327.23 [G47.33 ICD-10]) - Nocturnal Hypoxemia (327.26 [G47.36 ICD-10])  RECOMMENDATIONS - Therapeutic CPAP titration to determine optimal pressure required to alleviate sleep disordered breathing. - Avoid alcohol, sedatives and other CNS depressants that may worsen sleep apnea and disrupt normal sleep architecture. - Sleep hygiene should be reviewed to assess factors that may improve sleep quality. - Weight management and regular exercise should be initiated or continued if appropriate.  [place], American Board of Sleep Medicine  ELECTRONICALLY SIGNED ON:  05/24/2017, 8:45 PM [place] PH: [phone]   FX: [phone] [place]

## 2017-05-25 NOTE — Progress Notes (Signed)
Noted. Just treating sleep problem may help resolve her complaints.

## 2017-05-28 ENCOUNTER — Telehealth: Payer: Self-pay | Admitting: *Deleted

## 2017-05-28 DIAGNOSIS — G4733 Obstructive sleep apnea (adult) (pediatric): Secondary | ICD-10-CM

## 2017-05-28 NOTE — Telephone Encounter (Signed)
-----   Message from Sueanne Margarita, MD sent at 05/24/2017  8:47 PM EDT ----- Please let patient know that they have sleep apnea and recommend CPAP titration. Please set up titration in the sleep lab.

## 2017-05-28 NOTE — Telephone Encounter (Addendum)
Informed patient of sleep study results and patient understanding was verbalized. Patient understands Dr Radford Pax recommendations a CPAP Titration. Patient understands her sleep study is scheduled for Thursday July 26 2017. Patient understands her  titration will be done at [facility] sleep lab. Patient understands she will receive a sleep packet in a week or so. Patient understands to call if she does not receive the sleep packet in a timely manner. Patient agrees with treatment and thanked me for call

## 2017-05-29 ENCOUNTER — Encounter: Payer: Self-pay | Admitting: *Deleted

## 2017-05-30 ENCOUNTER — Telehealth: Payer: Self-pay | Admitting: *Deleted

## 2017-05-30 NOTE — Telephone Encounter (Signed)
-----   Message from Sueanne Margarita, MD sent at 05/24/2017  8:47 PM EDT ----- Please let patient know that they have sleep apnea and recommend CPAP titration. Please set up titration in the sleep lab.

## 2017-05-30 NOTE — Telephone Encounter (Signed)
Informed patient of sleep study results and patient understanding was verbalized. Patient understands Dr Radford Pax recommendations a CPAP Titration. Patient understands her sleep study is scheduled for Thursday July 26 2017. Patient understands her  titration will be done at [facility] sleep lab. Patient understands she will receive a sleep packet in a week or so. Patient understands to call if she does not receive the sleep packet in a timely manner. Patient agrees with treatment and thanked me for call

## 2017-06-04 ENCOUNTER — Encounter: Payer: BLUE CROSS/BLUE SHIELD | Admitting: *Deleted

## 2017-06-04 DIAGNOSIS — Z1231 Encounter for screening mammogram for malignant neoplasm of breast: Secondary | ICD-10-CM | POA: Diagnosis not present

## 2017-07-26 ENCOUNTER — Ambulatory Visit (HOSPITAL_BASED_OUTPATIENT_CLINIC_OR_DEPARTMENT_OTHER): Payer: BLUE CROSS/BLUE SHIELD | Attending: Cardiology | Admitting: Cardiology

## 2017-07-26 VITALS — Ht 60.0 in | Wt 285.0 lb

## 2017-07-26 DIAGNOSIS — G4733 Obstructive sleep apnea (adult) (pediatric): Secondary | ICD-10-CM | POA: Diagnosis not present

## 2017-08-23 NOTE — Procedures (Signed)
   Patient Name: Barbara Hinton, Barbara Hinton Date: 07/26/2017 Gender: Female D.O.B: 22-May-1971 Age (years): 34 Referring Provider: Fransico Him MD, ABSM Height (inches): 60 Interpreting Physician: Fransico Him MD, ABSM Weight (lbs): 285 RPSGT: Laren Everts BMI: 41 MRN: [id number] Neck Size: 14.50  CLINICAL INFORMATION The patient is referred for a CPAP titration to treat sleep apnea.  SLEEP STUDY TECHNIQUE As per the AASM Manual for the Scoring of Sleep and Associated Events v2.3 (April 2016) with a hypopnea requiring 4% desaturations.  The channels recorded and monitored were frontal, central and occipital EEG, electrooculogram (EOG), submentalis EMG (chin), nasal and oral airflow, thoracic and abdominal wall motion, anterior tibialis EMG, snore microphone, electrocardiogram, and pulse oximetry. Continuous positive airway pressure (CPAP) was initiated at the beginning of the study and titrated to treat sleep-disordered breathing.  MEDICATIONS Medications self-administered by patient taken the night of the study : N/A  TECHNICIAN COMMENTS Comments added by technician: NONE  Comments added by scorer: N/A  RESPIRATORY PARAMETERS Optimal PAP Pressure (cm): 10  AHI at Optimal Pressure (/hr):0.6 Overall Minimal O2 (%):87.00  Supine % at Optimal Pressure (%): 100 Minimal O2 at Optimal Pressure (%): 90.0    SLEEP ARCHITECTURE The study was initiated at 10:52:57 PM and ended at 5:34:59 AM.  Sleep onset time was 20.7 minutes and the sleep efficiency was 87.7%. The total sleep time was 352.5 minutes.  The patient spent 5.11% of the night in stage N1 sleep, 60.85% in stage N2 sleep, 4.40% in stage N3 and 29.65% in REM.Stage REM latency was 78.0 minutes  Wake after sleep onset was 28.9. Alpha intrusion was absent. Supine sleep was 77.16%.  CARDIAC DATA The 2 lead EKG demonstrated sinus rhythm. The mean heart rate was 75.32 beats per minute. Other EKG findings include: PVCs.  LEG  MOVEMENT DATA The total Periodic Limb Movements of Sleep (PLMS) were 0. The PLMS index was 0.00. A PLMS index of <15 is considered normal in adults.  IMPRESSIONS - The optimal PAP pressure was 10 cm of water. - Central sleep apnea was not noted during this titration (CAI = 0.3/h). - Mild oxygen desaturations were observed during this titration (min O2 = 87.00%). - No snoring was audible during this study. - 2-lead EKG demonstrated: PVCs - Clinically significant periodic limb movements were not noted during this study. Arousals associated with PLMs were rare.  DIAGNOSIS - Obstructive Sleep Apnea (327.23 [G47.33 ICD-10])  RECOMMENDATIONS - Trial of CPAP therapy on 10 cm H2O with a X-Small size Resmed Full Face Mask AirFit F10 for Her mask and heated humidification. - Avoid alcohol, sedatives and other CNS depressants that may worsen sleep apnea and disrupt normal sleep architecture. - Sleep hygiene should be reviewed to assess factors that may improve sleep quality. - Weight management and regular exercise should be initiated or continued. - Return to Sleep Center for re-evaluation after 10 weeks of therapy  [place], [place] [facility]  ELECTRONICALLY SIGNED ON:  08/23/2017, 2:51 PM [place] PH: [phone]   FX: [phone] [place]

## 2017-08-24 ENCOUNTER — Telehealth: Payer: Self-pay | Admitting: *Deleted

## 2017-08-24 NOTE — Telephone Encounter (Signed)
Informed patient of titration results and verbalized understanding was indicated. Patient understands her CPAP Titration was successful. Patient understands Dr Radford Pax has ordered her a CPAP in EPIC. Patient understands she will be contacted by [facility] to set up her cpap. She understands to call if [facility] does not contact her with new setup in a timely manner. She understands she will be called once confirmation has been received from [facility] that she has received her new machine to schedule 10 week follow up appointment.  [facility] notified of new cpap order  Please add to airview She was grateful for the call and thanked me  Patient understands Dr Radford Pax has ordered her a CPAP in EPIC.

## 2017-08-24 NOTE — Telephone Encounter (Signed)
-----   Message from Sueanne Margarita, MD sent at 08/23/2017  2:53 PM EDT ----- Please let patient know that they had a successful PAP titration and let DME know that orders are in EPIC.  Please set up 10 week OV with me.

## 2017-09-10 DIAGNOSIS — M542 Cervicalgia: Secondary | ICD-10-CM | POA: Diagnosis not present

## 2017-09-14 ENCOUNTER — Ambulatory Visit (INDEPENDENT_AMBULATORY_CARE_PROVIDER_SITE_OTHER): Payer: BLUE CROSS/BLUE SHIELD

## 2017-09-14 ENCOUNTER — Ambulatory Visit: Payer: BLUE CROSS/BLUE SHIELD | Admitting: Physician Assistant

## 2017-09-14 ENCOUNTER — Encounter: Payer: Self-pay | Admitting: Physician Assistant

## 2017-09-14 DIAGNOSIS — M9901 Segmental and somatic dysfunction of cervical region: Secondary | ICD-10-CM

## 2017-09-14 DIAGNOSIS — M542 Cervicalgia: Secondary | ICD-10-CM | POA: Diagnosis not present

## 2017-09-14 MED ORDER — METHYLPREDNISOLONE ACETATE 80 MG/ML IJ SUSP
80.0000 mg | Freq: Once | INTRAMUSCULAR | Status: AC
  Administered 2017-09-14: 80 mg via INTRAMUSCULAR

## 2017-09-14 MED ORDER — FUTURO SOFT CERVICAL COLLAR MISC: 1.0000 [IU] | Freq: Every day | 0 refills | Status: DC

## 2017-09-14 NOTE — Patient Instructions (Signed)
Cervical Radiculopathy  Cervical radiculopathy means that a nerve in the neck is pinched or bruised. This can cause pain or loss of feeling (numbness) that runs from your neck to your arm and fingers.  Follow these instructions at home:  Managing pain  ? Take over-the-counter and prescription medicines only as told by your doctor.  ? If directed, put ice on the injured or painful area.  ? Put ice in a plastic bag.  ? Place a towel between your skin and the bag.  ? Leave the ice on for 20 minutes, 2?3 times per day.  ? If ice does not help, you can try using heat. Take a warm shower or warm bath, or use a heat pack as told by your doctor.  ? You may try a gentle neck and shoulder massage.  Activity  ? Rest as needed. Follow instructions from your doctor about any activities to avoid.  ? Do exercises as told by your doctor or physical therapist.  General instructions  ? If you were given a soft collar, wear it as told by your doctor.  ? Use a flat pillow when you sleep.  ? Keep all follow-up visits as told by your doctor. This is important.  Contact a doctor if:  ? Your condition does not improve with treatment.  Get help right away if:  ? Your pain gets worse and is not controlled with medicine.  ? You lose feeling or feel weak in your hand, arm, face, or leg.  ? You have a fever.  ? You have a stiff neck.  ? You cannot control when you poop or pee (have incontinence).  ? You have trouble with walking, balance, or talking.  This information is not intended to replace advice given to you by your health care provider. Make sure you discuss any questions you have with your health care provider.  Document Released: 10/12/2011 Document Revised: 03/30/2016 Document Reviewed: 12/17/2014  Elsevier Interactive Patient Education ? 2018 Elsevier Inc.
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
## 2017-09-25 ENCOUNTER — Telehealth: Payer: Self-pay | Admitting: *Deleted

## 2017-09-25 DIAGNOSIS — G4733 Obstructive sleep apnea (adult) (pediatric): Secondary | ICD-10-CM | POA: Diagnosis not present

## 2017-09-25 NOTE — Telephone Encounter (Signed)
Patient called today to say she was getting set-up with her CPAP. Patient has a 10 week sleep appointment scehduled for 12/04/2017 at 2:20.

## 2017-10-02 ENCOUNTER — Ambulatory Visit (INDEPENDENT_AMBULATORY_CARE_PROVIDER_SITE_OTHER): Payer: BLUE CROSS/BLUE SHIELD | Admitting: Physician Assistant

## 2017-10-02 ENCOUNTER — Encounter: Payer: Self-pay | Admitting: Physician Assistant

## 2017-10-02 DIAGNOSIS — S134XXD Sprain of ligaments of cervical spine, subsequent encounter: Secondary | ICD-10-CM | POA: Diagnosis not present

## 2017-10-02 DIAGNOSIS — S134XXA Sprain of ligaments of cervical spine, initial encounter: Secondary | ICD-10-CM | POA: Insufficient documentation

## 2017-10-02 DIAGNOSIS — M9901 Segmental and somatic dysfunction of cervical region: Secondary | ICD-10-CM

## 2017-10-02 NOTE — Patient Instructions (Signed)
In a few days you may receive a survey in the mail or online from [employer] regarding your visit with us today. Please take a moment to fill this out. Your feedback is very important to our whole office. It can help us better understand your needs as well as improve your experience and satisfaction. Thank you for taking your time to complete it. We care about you.  Ollin Hochmuth, PA-C  
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
## 2017-10-25 ENCOUNTER — Encounter: Payer: Self-pay | Admitting: Physician Assistant

## 2017-10-25 ENCOUNTER — Ambulatory Visit: Payer: BLUE CROSS/BLUE SHIELD | Admitting: Physician Assistant

## 2017-10-25 VITALS — BP 114/80 | HR 60 | Temp 97.3°F | Ht 60.0 in | Wt 289.4 lb

## 2017-10-25 DIAGNOSIS — G4733 Obstructive sleep apnea (adult) (pediatric): Secondary | ICD-10-CM | POA: Diagnosis not present

## 2017-10-25 DIAGNOSIS — M9901 Segmental and somatic dysfunction of cervical region: Secondary | ICD-10-CM

## 2017-10-25 DIAGNOSIS — S134XXD Sprain of ligaments of cervical spine, subsequent encounter: Secondary | ICD-10-CM | POA: Diagnosis not present

## 2017-10-25 NOTE — Patient Instructions (Signed)
In a few days you may receive a survey in the mail or online from [employer] regarding your visit with us today. Please take a moment to fill this out. Your feedback is very important to our whole office. It can help us better understand your needs as well as improve your experience and satisfaction. Thank you for taking your time to complete it. We care about you.  Cheryln Balcom, PA-C  
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
## 2017-11-02 ENCOUNTER — Other Ambulatory Visit: Payer: Self-pay | Admitting: *Deleted

## 2017-11-02 MED ORDER — FLUTICASONE-SALMETEROL 100-50 MCG/DOSE IN AEPB: 1.0000 | INHALATION_SPRAY | Freq: Two times a day (BID) | RESPIRATORY_TRACT | 5 refills | Status: DC

## 2017-11-25 DIAGNOSIS — G4733 Obstructive sleep apnea (adult) (pediatric): Secondary | ICD-10-CM | POA: Diagnosis not present

## 2017-12-04 ENCOUNTER — Ambulatory Visit (INDEPENDENT_AMBULATORY_CARE_PROVIDER_SITE_OTHER): Payer: BLUE CROSS/BLUE SHIELD | Admitting: Cardiology

## 2017-12-04 ENCOUNTER — Ambulatory Visit: Payer: BLUE CROSS/BLUE SHIELD | Admitting: Physician Assistant

## 2017-12-04 ENCOUNTER — Ambulatory Visit (INDEPENDENT_AMBULATORY_CARE_PROVIDER_SITE_OTHER): Payer: BLUE CROSS/BLUE SHIELD

## 2017-12-04 ENCOUNTER — Encounter: Payer: Self-pay | Admitting: Physician Assistant

## 2017-12-04 ENCOUNTER — Encounter: Payer: Self-pay | Admitting: Cardiology

## 2017-12-04 VITALS — BP 132/74 | HR 92 | Ht 60.0 in | Wt 295.2 lb

## 2017-12-04 VITALS — BP 119/74 | HR 82 | Ht 60.0 in | Wt 293.8 lb

## 2017-12-04 DIAGNOSIS — G4733 Obstructive sleep apnea (adult) (pediatric): Secondary | ICD-10-CM

## 2017-12-04 DIAGNOSIS — M79672 Pain in left foot: Secondary | ICD-10-CM

## 2017-12-04 DIAGNOSIS — M7731 Calcaneal spur, right foot: Secondary | ICD-10-CM | POA: Diagnosis not present

## 2017-12-04 HISTORY — DX: Obstructive sleep apnea (adult) (pediatric): G47.33

## 2017-12-04 NOTE — Patient Instructions (Signed)

## 2017-12-04 NOTE — Progress Notes (Signed)
Cardiology Office Note:    Date:  12/04/2017   ID:  Barbara, Hinton [birth year], MRN [id number]  PCP:  Terald Sleeper, PA-C  Cardiologist:  No primary care provider on file.    Referring MD: Terald Sleeper, PA-C   Chief Complaint  Patient presents with  . Sleep Apnea    History of Present Illness:    Barbara Hinton is a 47 y.o. female with a hx of obesity, snoring and excessive daytime sleepiness with an ESS of 16 who was referred by Dr. Tamala Julian for sleep study.  Her PSG showed mild OSA overall with an AHI of 9.8/hr but severe during REM sleep with an AHI of 53.8/hr and nocturnal hypoxemia with O2 desats as low as 81%.  She underwent CPAP titration to 10cm H2O.  She is doing well with her CPAP device and thinks that she has gotten used to it.  She tolerates the full face mask and feels the pressure is adequate.  Since going on CPAP she feels rested in the am and has no significant daytime sleepiness if she sleeps over 6 hours a night.  She has some mouth dryness.  She does not think that he snores.     Past Medical History:  Diagnosis Date  . Asthma   . OSA (obstructive sleep apnea) 12/04/2017   Mild with AHI overall 9.8/hr but during REM sleep severe with AHI 53.8/hr and nocturnal hypoxemia now on CPAP at 10cm H2O.      History reviewed. No pertinent surgical history.  Current Medications: Current Meds  Medication Sig  . albuterol (PROVENTIL HFA;VENTOLIN HFA) 108 (90 Base) MCG/ACT inhaler Inhale 2 puffs into the lungs every 6 (six) hours as needed for wheezing or shortness of breath.  Marland Kitchen aspirin 81 MG tablet Take 81 mg by mouth 2 (two) times a week.  . cyclobenzaprine (FLEXERIL) 5 MG tablet 1 TO 2 TABLETS EVERY 8 HOURS AS NEEDED FOR THE MUSCLE SPASMS.  . Fluticasone-Salmeterol (ADVAIR) 100-50 MCG/DOSE AEPB Inhale 1 puff into the lungs 2 (two) times daily.  . montelukast (SINGULAIR) 10 MG tablet Take 10 mg by mouth at bedtime.  Marland Kitchen NORTREL 7/7/7 0.5/0.75/1-35 MG-MCG tablet TAKE 1  TABLET BY MOUTH EVERY DAY  . Vitamin D, Ergocalciferol, (DRISDOL) 50000 units CAPS capsule TAKE ONE CAPSULE BY MOUTH EVERY WEEK     Allergies:   Patient has no known allergies.   Social History   Socioeconomic History  . Marital status: Married    Spouse name: None  . Number of children: None  . Years of education: None  . Highest education level: None  Social Needs  . Financial resource strain: None  . Food insecurity - worry: None  . Food insecurity - inability: None  . Transportation needs - medical: None  . Transportation needs - non-medical: None  Occupational History  . None  Tobacco Use  . Smoking status: Passive Smoke Exposure - Never Smoker  . Smokeless tobacco: Never Used  Substance and Sexual Activity  . Alcohol use: No  . Drug use: No  . Sexual activity: None  Other Topics Concern  . None  Social History Narrative  . None     Family History: The patient's family history includes Diabetes in her father and mother; Heart attack in her maternal grandfather and paternal grandfather; Hypertension in her brother and father.  ROS:   Please see the history of present illness.    ROS  All other systems reviewed and negative.  EKGs/Labs/Other Studies Reviewed:    The following studies were reviewed today: CPAP download  EKG:  EKG is not ordered today.    Recent Labs: No results found for requested labs within last 8760 hours.   Recent Lipid Panel No results found for: CHOL, TRIG, HDL, CHOLHDL, VLDL, LDLCALC, LDLDIRECT  Physical Exam:    VS:  BP 132/74   Pulse 92   Ht 5' (1.524 m)   Wt 295 lb 3.2 oz (133.9 kg)   SpO2 98%   BMI 57.65 kg/m     Wt Readings from Last 3 Encounters:  12/04/17 295 lb 3.2 oz (133.9 kg)  12/04/17 293 lb 12.8 oz (133.3 kg)  10/25/17 289 lb 6.4 oz (131.3 kg)     GEN:  Well nourished, well developed in no acute distress HEENT: Normal NECK: No JVD; No carotid bruits LYMPHATICS: No lymphadenopathy CARDIAC: RRR, no  murmurs, rubs, gallops RESPIRATORY:  Clear to auscultation without rales, wheezing or rhonchi  ABDOMEN: Soft, non-tender, non-distended MUSCULOSKELETAL:  No edema; No deformity  SKIN: Warm and dry NEUROLOGIC:  Alert and oriented x 3 PSYCHIATRIC:  Normal affect   ASSESSMENT:    1. OSA (obstructive sleep apnea)   2. Obesity, morbid ([place])    PLAN:    In order of problems listed above:  1.  OSA - the patient is tolerating PAP therapy well without any problems. The PAP download was reviewed today and showed an AHI of 0.9/hr on 10 cm H2O with 100% compliance in using more than 4 hours nightly.  The patient has been using and benefiting from PAP use and will continue to benefit from therapy.   2.  Obesity - she is working 2 jobs and has no time for exercise.     Medication Adjustments/Labs and Tests Ordered: Current medicines are reviewed at length with the patient today.  Concerns regarding medicines are outlined above.  No orders of the defined types were placed in this encounter.  No orders of the defined types were placed in this encounter.   Signed, Fransico Him, MD  12/04/2017 3:02 PM    [facility]

## 2017-12-04 NOTE — Patient Instructions (Signed)
In a few days you may receive a survey in the mail or online from [employer] regarding your visit with us today. Please take a moment to fill this out. Your feedback is very important to our whole office. It can help us better understand your needs as well as improve your experience and satisfaction. Thank you for taking your time to complete it. We care about you.  Hajer Dwyer, PA-C  

## 2017-12-05 NOTE — Progress Notes (Signed)
BP 119/74   Pulse 82   Ht 5' (1.524 m)   Wt 293 lb 12.8 oz (133.3 kg)   BMI 57.38 kg/m    Subjective:    Patient ID: Barbara Hinton, female    DOB: 11-09-1970, 47 y.o.   MRN: [id number]  HPI: Barbara Hinton is a 47 y.o. female presenting on 12/04/2017 for Foot Pain (and swollen. Left )  Patient with known history of foot issues including plantar fasciitis and heel spur comes in today with significant left foot pain and swelling.  She does work 2 jobs in the Nurse, adult.  So there are days that she stands more than 12 hours.  She does wear structured shoes to work.  She admits that she does wear unstructured shoes such as flip-flops at home.  The swelling was quite large on the medial top of the left foot.  She showed me a picture of it.  There is still some swelling today but not as severe as 2 days ago.  She does take some anti-inflammatory.  She has tried to wear some compression on it but this actually has hurt more.  We have discussed the possibility of her starting to wear compression knee socks to help with generalized edema in both legs since she does do a standing job.  She will look into buying those.  We will plan to make a referral to Ortho for her foot problem.  X-ray was normal only showing a heel spur  Relevant past medical, surgical, family and social history reviewed and updated as indicated. Allergies and medications reviewed and updated.  Past Medical History:  Diagnosis Date  . Asthma   . OSA (obstructive sleep apnea) 12/04/2017   Mild with AHI overall 9.8/hr but during REM sleep severe with AHI 53.8/hr and nocturnal hypoxemia now on CPAP at 10cm H2O.      History reviewed. No pertinent surgical history.  Review of Systems  Constitutional: Negative.  Negative for activity change, fatigue and fever.  HENT: Negative.   Eyes: Negative.   Respiratory: Negative.  Negative for cough.   Cardiovascular: Negative.  Negative for chest pain.  Gastrointestinal: Negative.   Negative for abdominal pain.  Endocrine: Negative.   Genitourinary: Negative.  Negative for dysuria.  Musculoskeletal: Positive for arthralgias, gait problem and joint swelling.  Skin: Negative.     Allergies as of 12/04/2017   No Known Allergies     Medication List        Accurate as of 12/04/17 11:59 PM. Always use your most recent med list.          albuterol 108 (90 Base) MCG/ACT inhaler Commonly known as:  PROVENTIL HFA;VENTOLIN HFA Inhale 2 puffs into the lungs every 6 (six) hours as needed for wheezing or shortness of breath.   aspirin 81 MG tablet Take 81 mg by mouth 2 (two) times a week.   cyclobenzaprine 5 MG tablet Commonly known as:  FLEXERIL 1 TO 2 TABLETS EVERY 8 HOURS AS NEEDED FOR THE MUSCLE SPASMS.   Fluticasone-Salmeterol 100-50 MCG/DOSE Aepb Commonly known as:  ADVAIR Inhale 1 puff into the lungs 2 (two) times daily.   montelukast 10 MG tablet Commonly known as:  SINGULAIR Take 10 mg by mouth at bedtime.   NORTREL 7/7/7 0.5/0.75/1-35 MG-MCG tablet Generic drug:  norethindrone-ethinyl estradiol TAKE 1 TABLET BY MOUTH EVERY DAY   Vitamin D (Ergocalciferol) 50000 units Caps capsule Commonly known as:  DRISDOL TAKE ONE CAPSULE BY MOUTH EVERY WEEK  Objective:    BP 119/74   Pulse 82   Ht 5' (1.524 m)   Wt 293 lb 12.8 oz (133.3 kg)   BMI 57.38 kg/m   No Known Allergies  Physical Exam  Constitutional: She is oriented to person, place, and time. She appears well-developed and well-nourished.  HENT:  Head: Normocephalic and atraumatic.  Eyes: Conjunctivae and EOM are normal. Pupils are equal, round, and reactive to light.  Cardiovascular: Normal rate, regular rhythm, normal heart sounds and intact distal pulses.  Pulmonary/Chest: Effort normal and breath sounds normal.  Abdominal: Soft. Bowel sounds are normal.  Musculoskeletal:       Left foot: There is tenderness and swelling. There is no deformity.       Feet:  Neurological:  She is alert and oriented to person, place, and time. She has normal reflexes.  Skin: Skin is warm and dry. No rash noted.  Psychiatric: She has a normal mood and affect. Her behavior is normal. Judgment and thought content normal.        Assessment & Plan:   1. Left foot pain - DG Foot Complete Left; Future - Ambulatory referral to Orthopedic Surgery    Current Outpatient Medications:  .  albuterol (PROVENTIL HFA;VENTOLIN HFA) 108 (90 Base) MCG/ACT inhaler, Inhale 2 puffs into the lungs every 6 (six) hours as needed for wheezing or shortness of breath., Disp: , Rfl:  .  aspirin 81 MG tablet, Take 81 mg by mouth 2 (two) times a week., Disp: , Rfl:  .  cyclobenzaprine (FLEXERIL) 5 MG tablet, 1 TO 2 TABLETS EVERY 8 HOURS AS NEEDED FOR THE MUSCLE SPASMS., Disp: , Rfl: 0 .  Fluticasone-Salmeterol (ADVAIR) 100-50 MCG/DOSE AEPB, Inhale 1 puff into the lungs 2 (two) times daily., Disp: 60 each, Rfl: 5 .  montelukast (SINGULAIR) 10 MG tablet, Take 10 mg by mouth at bedtime., Disp: , Rfl:  .  NORTREL 7/7/7 0.5/0.75/1-35 MG-MCG tablet, TAKE 1 TABLET BY MOUTH EVERY DAY, Disp: 84 tablet, Rfl: 3 .  Vitamin D, Ergocalciferol, (DRISDOL) 50000 units CAPS capsule, TAKE ONE CAPSULE BY MOUTH EVERY WEEK, Disp: 12 capsule, Rfl: 3 Continue all other maintenance medications as listed above.  Follow up plan: Return if symptoms worsen or fail to improve.  Educational handout given for [place] PA-C [place] [address]  [place], [place] [address] [phone]   12/05/2017, 9:12 AM

## 2017-12-09 ENCOUNTER — Other Ambulatory Visit: Payer: Self-pay | Admitting: Physician Assistant

## 2017-12-12 DIAGNOSIS — M76822 Posterior tibial tendinitis, left leg: Secondary | ICD-10-CM | POA: Diagnosis not present

## 2017-12-12 DIAGNOSIS — M79672 Pain in left foot: Secondary | ICD-10-CM | POA: Diagnosis not present

## 2017-12-14 ENCOUNTER — Ambulatory Visit (INDEPENDENT_AMBULATORY_CARE_PROVIDER_SITE_OTHER): Payer: BLUE CROSS/BLUE SHIELD | Admitting: Physician Assistant

## 2017-12-14 ENCOUNTER — Encounter: Payer: Self-pay | Admitting: Physician Assistant

## 2017-12-14 VITALS — BP 122/80 | HR 80 | Ht 60.0 in | Wt 293.6 lb

## 2017-12-14 DIAGNOSIS — M9901 Segmental and somatic dysfunction of cervical region: Secondary | ICD-10-CM

## 2017-12-14 DIAGNOSIS — S134XXD Sprain of ligaments of cervical spine, subsequent encounter: Secondary | ICD-10-CM | POA: Diagnosis not present
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
## 2017-12-14 NOTE — Patient Instructions (Addendum)
In a few days you may receive a survey in the mail or online from [employer] regarding your visit with us today. Please take a moment to fill this out. Your feedback is very important to our whole office. It can help us better understand your needs as well as improve your experience and satisfaction. Thank you for taking your time to complete it. We care about you.  Glenola Wheat, PA-C  

## 2017-12-18 ENCOUNTER — Encounter: Payer: Self-pay | Admitting: Physical Therapy

## 2017-12-18 ENCOUNTER — Other Ambulatory Visit: Payer: Self-pay

## 2017-12-18 ENCOUNTER — Ambulatory Visit: Payer: BLUE CROSS/BLUE SHIELD | Attending: Student | Admitting: Physical Therapy

## 2017-12-18 DIAGNOSIS — M25572 Pain in left ankle and joints of left foot: Secondary | ICD-10-CM | POA: Diagnosis not present

## 2017-12-18 DIAGNOSIS — R600 Localized edema: Secondary | ICD-10-CM | POA: Insufficient documentation

## 2017-12-18 DIAGNOSIS — R2689 Other abnormalities of gait and mobility: Secondary | ICD-10-CM | POA: Insufficient documentation
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
## 2017-12-18 NOTE — Patient Instructions (Signed)
Gastroc / Heel Cord Stretch - Seated With Towel   Sit on floor, towel around ball of foot. Gently pull foot in toward body, stretching heel cord and calf. Hold for _30-60__ seconds. Repeat _3__ times. Do 2___ times per day.   Ankle Alphabet   Using left ankle and foot only, trace the letters of the alphabet. Perform A to Z. Repeat _1___ times per set. Do ____ sets per session. Do __2-3__ sessions per day.  http://orth.exer.us/16   Copyright  VHI. All rights reserved.    Ankle Circles   Slowly rotate right foot and ankle clockwise then counterclockwise. Gradually increase range of motion. Avoid pain. Circle __10__ times each direction per set. Do ____ sets per session. Do 2-3____ sessions per day.  http://orth.exer.us/30   Copyright  VHI. All rights reserved.    ROM: Plantar / Dorsiflexion   With left leg relaxed, gently flex and extend ankle. Move through full range of motion. Avoid pain. Repeat __20__ times per set. Do ____ sets per session. Do __3-4__ sessions per day.  http://orth.exer.us/34   Copyright  VHI. All rights reserved.    ROM: Inversion / Eversion   With left leg relaxed, gently turn ankle and foot in and out. Move through full range of motion. Avoid pain.  Do in sitting to avoid hip motion. Repeat _20___ times per set. Do ____ sets per session. Do _3-4___ sessions per day.  http://orth.exer.us/36   Copyright  VHI. All rights reserved.   IONTOPHORESIS PATIENT PRECAUTIONS & CONTRAINDICATIONS:  . Redness under one or both electrodes can occur.  This characterized by a uniform redness that usually disappears within 12 hours of treatment. . Small pinhead size blisters may result in response to the drug.  Contact your physician if the problem persists more than 24 hours. . On rare occasions, iontophoresis therapy can result in temporary skin reactions such as rash, inflammation, irritation or burns.  The skin reactions may be the result of individual  sensitivity to the ionic solution used, the condition of the skin at the start of treatment, reaction to the materials in the electrodes, allergies or sensitivity to dexamethasone, or a poor connection between the patch and your skin.  Discontinue using iontophoresis if you have any of these reactions and report to your therapist. . Remove the Patch or electrodes if you have any undue sensation of pain or burning during the treatment and report discomfort to your therapist. . Tell your Therapist if you have had known adverse reactions to the application of electrical current. . If using the Patch, the LED light will turn off when treatment is complete and the patch can be removed.  Approximate treatment time is 1-3 hours.  Remove the patch when light goes off or after 6 hours. . The Patch can be worn during normal activity, however excessive motion where the electrodes have been placed can cause poor contact between the skin and the electrode or uneven electrical current resulting in greater risk of skin irritation. Marland Kitchen Keep out of the reach of children.   . DO NOT use if you have a cardiac pacemaker or any other electrically sensitive implanted device. . DO NOT use if you have a known sensitivity to dexamethasone. . DO NOT use during Magnetic Resonance Imaging (MRI). . DO NOT use over broken or compromised skin (e.g. sunburn, cuts, or acne) due to the increased risk of skin reaction. . DO NOT SHAVE over the area to be treated:  To establish good contact between the  Patch and the skin, excessive hair may be clipped. . DO NOT place the Patch or electrodes on or over your eyes, directly over your heart, or brain. . DO NOT reuse the Patch or electrodes as this may cause burns to occur.   Madelyn Flavors, PT 12/18/17 10:56 AM;[facility] [place], [place], [address] Phone: [phone]   Fax:  [phone]

## 2017-12-21 ENCOUNTER — Ambulatory Visit: Payer: BLUE CROSS/BLUE SHIELD | Admitting: Physical Therapy

## 2017-12-21 DIAGNOSIS — M25572 Pain in left ankle and joints of left foot: Secondary | ICD-10-CM

## 2017-12-21 DIAGNOSIS — R600 Localized edema: Secondary | ICD-10-CM

## 2017-12-21 DIAGNOSIS — R2689 Other abnormalities of gait and mobility: Secondary | ICD-10-CM
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
## 2017-12-21 NOTE — Patient Instructions (Signed)
Gastroc Stretch    Stand with right foot back, leg straight, forward leg bent. Keeping heel on floor, turned slightly out, lean into wall until stretch is felt in calf. Hold _45___ seconds. Repeat __3__ times per set. Do ____ sets per session. Do __2__ sessions per day.  http://orth.exer.us/27   Copyright  VHI. All rights reserved.    Soleus Stretch    Stand with right foot back, both knees bent. Keeping heel on floor, turned slightly out, lean into wall until stretch is felt in lower calf. Hold _45___ seconds. Repeat __3__ times per set. Do ____ sets per session. Do _2___ sessions per day.  http://orth.exer.us/25   Barbara Hinton, PT 12/21/17 11:06 AM [facility] [address] [place], [place], [address] Phone: [phone]   Fax:  [phone]   Copyright  VHI. All rights reserved.

## 2017-12-26 DIAGNOSIS — G4733 Obstructive sleep apnea (adult) (pediatric): Secondary | ICD-10-CM | POA: Diagnosis not present

## 2017-12-27 ENCOUNTER — Encounter: Payer: Self-pay | Admitting: Physical Therapy

## 2017-12-27 ENCOUNTER — Ambulatory Visit: Payer: BLUE CROSS/BLUE SHIELD | Admitting: Physical Therapy

## 2017-12-27 DIAGNOSIS — M25572 Pain in left ankle and joints of left foot: Secondary | ICD-10-CM | POA: Diagnosis not present

## 2017-12-27 DIAGNOSIS — R2689 Other abnormalities of gait and mobility: Secondary | ICD-10-CM | POA: Diagnosis not present

## 2017-12-27 DIAGNOSIS — R600 Localized edema: Secondary | ICD-10-CM
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
## 2018-01-01 ENCOUNTER — Ambulatory Visit: Payer: BLUE CROSS/BLUE SHIELD | Admitting: Physical Therapy

## 2018-01-01 ENCOUNTER — Encounter: Payer: Self-pay | Admitting: Physical Therapy

## 2018-01-01 DIAGNOSIS — R2689 Other abnormalities of gait and mobility: Secondary | ICD-10-CM | POA: Diagnosis not present

## 2018-01-01 DIAGNOSIS — M25572 Pain in left ankle and joints of left foot: Secondary | ICD-10-CM | POA: Diagnosis not present

## 2018-01-01 DIAGNOSIS — R600 Localized edema: Secondary | ICD-10-CM | POA: Diagnosis not present
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
## 2018-01-10 ENCOUNTER — Encounter: Payer: Self-pay | Admitting: Physical Therapy

## 2018-01-10 ENCOUNTER — Ambulatory Visit: Payer: BLUE CROSS/BLUE SHIELD | Attending: Student | Admitting: Physical Therapy

## 2018-01-10 DIAGNOSIS — R600 Localized edema: Secondary | ICD-10-CM | POA: Diagnosis not present

## 2018-01-10 DIAGNOSIS — M25572 Pain in left ankle and joints of left foot: Secondary | ICD-10-CM | POA: Diagnosis not present

## 2018-01-10 DIAGNOSIS — R2689 Other abnormalities of gait and mobility: Secondary | ICD-10-CM | POA: Diagnosis not present
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
## 2018-01-14 ENCOUNTER — Ambulatory Visit: Payer: BLUE CROSS/BLUE SHIELD | Admitting: Physical Therapy

## 2018-01-14 ENCOUNTER — Encounter: Payer: Self-pay | Admitting: Physical Therapy

## 2018-01-14 DIAGNOSIS — R2689 Other abnormalities of gait and mobility: Secondary | ICD-10-CM

## 2018-01-14 DIAGNOSIS — M25572 Pain in left ankle and joints of left foot: Secondary | ICD-10-CM

## 2018-01-14 DIAGNOSIS — R600 Localized edema: Secondary | ICD-10-CM
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
## 2018-01-18 ENCOUNTER — Ambulatory Visit: Payer: BLUE CROSS/BLUE SHIELD | Admitting: Family Medicine

## 2018-01-18 ENCOUNTER — Ambulatory Visit: Payer: BLUE CROSS/BLUE SHIELD | Admitting: Physical Therapy

## 2018-01-18 ENCOUNTER — Encounter: Payer: Self-pay | Admitting: Family Medicine

## 2018-01-18 VITALS — BP 109/74 | HR 80 | Temp 97.3°F | Ht 60.0 in | Wt 297.6 lb

## 2018-01-18 DIAGNOSIS — R600 Localized edema: Secondary | ICD-10-CM | POA: Diagnosis not present

## 2018-01-18 DIAGNOSIS — M62830 Muscle spasm of back: Secondary | ICD-10-CM | POA: Diagnosis not present

## 2018-01-18 DIAGNOSIS — R2689 Other abnormalities of gait and mobility: Secondary | ICD-10-CM | POA: Diagnosis not present

## 2018-01-18 DIAGNOSIS — M25572 Pain in left ankle and joints of left foot: Secondary | ICD-10-CM | POA: Diagnosis not present

## 2018-01-18 MED ORDER — TIZANIDINE HCL 2 MG PO TABS
2.0000 mg | ORAL_TABLET | Freq: Three times a day (TID) | ORAL | 0 refills | Status: DC | PRN
Start: 2018-01-18 — End: 2020-04-20

## 2018-01-18 NOTE — Patient Instructions (Signed)
Great to meet you! 

## 2018-01-18 NOTE — Progress Notes (Signed)
   HPI  Patient presents today with back pain.  Patient has right upper back pain between her spine and her right shoulder blade.  She states this started after coloring her hair when she spent a prolonged time with her hands over her head.  She states she felt a small cramp in that area and then began to have what she describes as muscle spasms.  She has crampy upper back pain that comes and goes. Flexeril 10 mg was somewhat helpful but did not cause sedation. 2 Tylenol are helpful. She does not feel well enough to work today but does feel well enough to return to work tomorrow.  PMH: Smoking status noted ROS: Per HPI  Objective: BP 109/74   Pulse 80   Temp (!) 97.3 F (36.3 C) (Oral)   Ht 5' (1.524 m)   Wt 297 lb 9.6 oz (135 kg)   BMI 58.12 kg/m  Gen: NAD, alert, cooperative with exam HEENT: NCAT CV: RRR, good S1/S2, no murmur Resp: CTABL, no wheezes, non-labored Ext: No edema, warm Neuro: Alert and oriented, No gross deficits MSK Negative empty can L shoulder TTP over R sided rhomboids/teres major, between scapula and spine. No Midline ttp  Assessment and plan:  # Muscle spasm of back Continue supportive care, improving Out of work for 1 day Change to Zanaflex RTC with any concerns   Meds ordered this encounter  Medications  . tiZANidine (ZANAFLEX) 2 MG tablet    Sig: Take 1-2 tablets (2-4 mg total) by mouth every 8 (eight) hours as needed for muscle spasms.    Dispense:  30 tablet    Refill:  0    Laroy Apple, MD [facility] 01/18/2018, 9:49 AM
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
## 2018-01-23 DIAGNOSIS — G4733 Obstructive sleep apnea (adult) (pediatric): Secondary | ICD-10-CM | POA: Diagnosis not present

## 2018-01-30 ENCOUNTER — Encounter: Payer: Self-pay | Admitting: Physical Therapy

## 2018-01-30 ENCOUNTER — Ambulatory Visit: Payer: BLUE CROSS/BLUE SHIELD | Admitting: Physical Therapy

## 2018-01-30 DIAGNOSIS — R2689 Other abnormalities of gait and mobility: Secondary | ICD-10-CM | POA: Diagnosis not present

## 2018-01-30 DIAGNOSIS — R600 Localized edema: Secondary | ICD-10-CM | POA: Diagnosis not present

## 2018-01-30 DIAGNOSIS — M25572 Pain in left ankle and joints of left foot: Secondary | ICD-10-CM
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
## 2018-02-06 ENCOUNTER — Encounter: Payer: Self-pay | Admitting: Physical Therapy

## 2018-02-06 ENCOUNTER — Ambulatory Visit: Payer: BLUE CROSS/BLUE SHIELD | Attending: Student | Admitting: Physical Therapy

## 2018-02-06 DIAGNOSIS — M25572 Pain in left ankle and joints of left foot: Secondary | ICD-10-CM | POA: Insufficient documentation

## 2018-02-06 DIAGNOSIS — R2689 Other abnormalities of gait and mobility: Secondary | ICD-10-CM | POA: Insufficient documentation

## 2018-02-06 DIAGNOSIS — R600 Localized edema: Secondary | ICD-10-CM | POA: Diagnosis not present
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
## 2018-02-14 ENCOUNTER — Encounter: Payer: BLUE CROSS/BLUE SHIELD | Admitting: Physical Therapy

## 2018-02-18 ENCOUNTER — Ambulatory Visit: Payer: BLUE CROSS/BLUE SHIELD | Admitting: Physical Therapy

## 2018-02-18 DIAGNOSIS — R2689 Other abnormalities of gait and mobility: Secondary | ICD-10-CM

## 2018-02-18 DIAGNOSIS — M25572 Pain in left ankle and joints of left foot: Secondary | ICD-10-CM

## 2018-02-18 DIAGNOSIS — R600 Localized edema: Secondary | ICD-10-CM | POA: Diagnosis not present
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
## 2018-02-23 DIAGNOSIS — G4733 Obstructive sleep apnea (adult) (pediatric): Secondary | ICD-10-CM | POA: Diagnosis not present
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
## 2018-02-28 ENCOUNTER — Encounter: Payer: Self-pay | Admitting: Physical Therapy

## 2018-02-28 ENCOUNTER — Ambulatory Visit: Payer: BLUE CROSS/BLUE SHIELD | Admitting: Physical Therapy

## 2018-02-28 DIAGNOSIS — R600 Localized edema: Secondary | ICD-10-CM

## 2018-02-28 DIAGNOSIS — R2689 Other abnormalities of gait and mobility: Secondary | ICD-10-CM | POA: Diagnosis not present

## 2018-02-28 DIAGNOSIS — M25572 Pain in left ankle and joints of left foot: Secondary | ICD-10-CM | POA: Diagnosis not present
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
## 2018-03-07 ENCOUNTER — Encounter: Payer: BLUE CROSS/BLUE SHIELD | Admitting: *Deleted

## 2018-03-25 DIAGNOSIS — G4733 Obstructive sleep apnea (adult) (pediatric): Secondary | ICD-10-CM | POA: Diagnosis not present

## 2018-04-25 DIAGNOSIS — G4733 Obstructive sleep apnea (adult) (pediatric): Secondary | ICD-10-CM | POA: Diagnosis not present

## 2018-05-07 ENCOUNTER — Telehealth: Payer: Self-pay | Admitting: *Deleted

## 2018-05-07 MED ORDER — FLUTICASONE-SALMETEROL 100-50 MCG/DOSE IN AEPB: 1.0000 | INHALATION_SPRAY | Freq: Two times a day (BID) | RESPIRATORY_TRACT | 5 refills | Status: DC

## 2018-05-07 NOTE — Telephone Encounter (Signed)
Rx sent to [facility]

## 2018-05-25 DIAGNOSIS — G4733 Obstructive sleep apnea (adult) (pediatric): Secondary | ICD-10-CM | POA: Diagnosis not present

## 2018-06-19 ENCOUNTER — Telehealth: Payer: Self-pay | Admitting: *Deleted

## 2018-06-19 ENCOUNTER — Other Ambulatory Visit: Payer: Self-pay | Admitting: Physician Assistant

## 2018-06-19 MED ORDER — AMOXICILLIN 500 MG PO CAPS: 1000.0000 mg | ORAL_CAPSULE | Freq: Two times a day (BID) | ORAL | 0 refills | Status: DC

## 2018-06-19 NOTE — Telephone Encounter (Signed)
Sent amoxil
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
## 2018-06-25 DIAGNOSIS — G4733 Obstructive sleep apnea (adult) (pediatric): Secondary | ICD-10-CM | POA: Diagnosis not present

## 2018-07-26 DIAGNOSIS — G4733 Obstructive sleep apnea (adult) (pediatric): Secondary | ICD-10-CM | POA: Diagnosis not present

## 2018-08-25 DIAGNOSIS — G4733 Obstructive sleep apnea (adult) (pediatric): Secondary | ICD-10-CM | POA: Diagnosis not present

## 2018-08-31 ENCOUNTER — Other Ambulatory Visit: Payer: Self-pay | Admitting: Physician Assistant

## 2018-09-25 DIAGNOSIS — G4733 Obstructive sleep apnea (adult) (pediatric): Secondary | ICD-10-CM | POA: Diagnosis not present

## 2018-12-15 ENCOUNTER — Other Ambulatory Visit: Payer: Self-pay | Admitting: Physician Assistant

## 2019-02-15 ENCOUNTER — Other Ambulatory Visit: Payer: Self-pay | Admitting: Physician Assistant

## 2019-04-28 ENCOUNTER — Other Ambulatory Visit: Payer: Self-pay | Admitting: *Deleted

## 2019-04-28 MED ORDER — FLUTICASONE-SALMETEROL 100-50 MCG/DOSE IN AEPB: 1.0000 | INHALATION_SPRAY | Freq: Two times a day (BID) | RESPIRATORY_TRACT | 5 refills | Status: DC

## 2019-10-09 DIAGNOSIS — Z20828 Contact with and (suspected) exposure to other viral communicable diseases: Secondary | ICD-10-CM | POA: Diagnosis not present

## 2019-11-14 ENCOUNTER — Other Ambulatory Visit: Payer: Self-pay | Admitting: Physician Assistant

## 2020-04-20 ENCOUNTER — Ambulatory Visit (INDEPENDENT_AMBULATORY_CARE_PROVIDER_SITE_OTHER): Payer: BC Managed Care – PPO | Admitting: Family

## 2020-04-20 ENCOUNTER — Other Ambulatory Visit (HOSPITAL_COMMUNITY)
Admission: RE | Admit: 2020-04-20 | Discharge: 2020-04-20 | Disposition: A | Discharge status: Home / Self Care | Payer: BC Managed Care – PPO | Source: Ambulatory Visit | Attending: Family | Admitting: Family

## 2020-04-20 ENCOUNTER — Ambulatory Visit (INDEPENDENT_AMBULATORY_CARE_PROVIDER_SITE_OTHER): Payer: BC Managed Care – PPO

## 2020-04-20 ENCOUNTER — Ambulatory Visit: Payer: BC Managed Care – PPO

## 2020-04-20 ENCOUNTER — Other Ambulatory Visit: Payer: Self-pay

## 2020-04-20 ENCOUNTER — Encounter: Payer: Self-pay | Admitting: Family

## 2020-04-20 VITALS — BP 131/87 | HR 104 | Temp 97.7°F | Ht 60.0 in | Wt 306.4 lb

## 2020-04-20 DIAGNOSIS — G4733 Obstructive sleep apnea (adult) (pediatric): Secondary | ICD-10-CM | POA: Diagnosis not present

## 2020-04-20 DIAGNOSIS — M1711 Unilateral primary osteoarthritis, right knee: Secondary | ICD-10-CM | POA: Diagnosis not present

## 2020-04-20 DIAGNOSIS — J45909 Unspecified asthma, uncomplicated: Secondary | ICD-10-CM | POA: Diagnosis not present

## 2020-04-20 DIAGNOSIS — M25561 Pain in right knee: Secondary | ICD-10-CM

## 2020-04-20 DIAGNOSIS — H01119 Allergic dermatitis of unspecified eye, unspecified eyelid: Secondary | ICD-10-CM

## 2020-04-20 DIAGNOSIS — Z0000 Encounter for general adult medical examination without abnormal findings: Secondary | ICD-10-CM | POA: Diagnosis not present

## 2020-04-20 DIAGNOSIS — Z01419 Encounter for gynecological examination (general) (routine) without abnormal findings: Secondary | ICD-10-CM | POA: Insufficient documentation

## 2020-04-20 DIAGNOSIS — Z0001 Encounter for general adult medical examination with abnormal findings: Secondary | ICD-10-CM | POA: Diagnosis not present

## 2020-04-20 DIAGNOSIS — E559 Vitamin D deficiency, unspecified: Secondary | ICD-10-CM | POA: Diagnosis not present

## 2020-04-20 DIAGNOSIS — Z1159 Encounter for screening for other viral diseases: Secondary | ICD-10-CM

## 2020-04-20 MED ORDER — ALBUTEROL SULFATE HFA 108 (90 BASE) MCG/ACT IN AERS: 2.0000 | INHALATION_SPRAY | Freq: Four times a day (QID) | RESPIRATORY_TRACT | 2 refills | Status: DC | PRN

## 2020-04-20 MED ORDER — EUCRISA 2 % EX OINT: 1.0000 "application " | TOPICAL_OINTMENT | Freq: Two times a day (BID) | CUTANEOUS | 1 refills | Status: DC

## 2020-04-20 MED ORDER — FLUTICASONE-SALMETEROL 100-50 MCG/DOSE IN AEPB: 1.0000 | INHALATION_SPRAY | Freq: Two times a day (BID) | RESPIRATORY_TRACT | 5 refills | Status: DC

## 2020-04-20 MED ORDER — DICLOFENAC SODIUM 75 MG PO TBEC: 75.0000 mg | DELAYED_RELEASE_TABLET | Freq: Two times a day (BID) | ORAL | 2 refills | Status: DC

## 2020-04-20 NOTE — Progress Notes (Signed)
Subjective:    Patient ID: Barbara Hinton, female    DOB: Nov 07, 1970, 49 y.o.   MRN: [id number]  Chief Complaint  Patient presents with  . Annual Exam    pap   PT presents to the office today for CPE with pap.  Asthma She complains of wheezing. There is no cough. This is a chronic problem. The current episode started more than 1 year ago. The problem occurs intermittently. The problem has been waxing and waning. She reports significant improvement on treatment. Her past medical history is significant for asthma.  Gynecologic Exam The patient's pertinent negatives include no genital lesions, vaginal bleeding or vaginal discharge. The current episode started more than 1 year ago. The problem occurs intermittently. Associated symptoms include rash.  Rash This is a recurrent problem. The current episode started more than 1 month ago. The problem has been waxing and waning since onset. Location: bilateral eye lid. Pertinent negatives include no cough. Her past medical history is significant for asthma.  Knee Pain  The incident occurred more than 1 week ago. There was no injury mechanism. The pain is present in the right knee. The pain is at a severity of 9/10. The pain is moderate. The pain has been intermittent since onset. Pertinent negatives include no muscle weakness or numbness. She reports no foreign bodies present. The symptoms are aggravated by weight bearing. She has tried rest and NSAIDs for the symptoms. The treatment provided mild relief.   OSA Pt uses CPAP nightly. Stable.   Review of Systems  Respiratory: Positive for wheezing. Negative for cough.   Genitourinary: Negative for vaginal discharge.  Skin: Positive for rash.  Neurological: Negative for numbness.  All other systems reviewed and are negative.  Family History  Problem Relation Age of Onset  . Diabetes Mother   . Diabetes Father   . Hypertension Father   . Hypertension Brother   . Heart attack Maternal  Grandfather   . Heart attack Paternal Grandfather    Social History   Socioeconomic History  . Marital status: Married    Spouse name: Not on file  . Number of children: Not on file  . Years of education: Not on file  . Highest education level: Not on file  Occupational History  . Not on file  Tobacco Use  . Smoking status: Passive Smoke Exposure - Never Smoker  . Smokeless tobacco: Never Used  Substance and Sexual Activity  . Alcohol use: No  . Drug use: No  . Sexual activity: Not on file  Other Topics Concern  . Not on file  Social History Narrative  . Not on file   Social Determinants of Health   Financial Resource Strain:   . Difficulty of Paying Living Expenses:   Food Insecurity:   . Worried About Charity fundraiser in the Last Year:   . Arboriculturist in the Last Year:   Transportation Needs:   . Film/video editor (Medical):   Marland Kitchen Lack of Transportation (Non-Medical):   Physical Activity:   . Days of Exercise per Week:   . Minutes of Exercise per Session:   Stress:   . Feeling of Stress :   Social Connections:   . Frequency of Communication with Friends and Family:   . Frequency of Social Gatherings with Friends and Family:   . Attends Religious Services:   . Active Member of Clubs or Organizations:   . Attends Archivist Meetings:   .  Marital Status:        Objective:   Physical Exam Vitals reviewed.  Constitutional:      General: She is not in acute distress.    Appearance: She is well-developed. She is obese.  HENT:     Head: Normocephalic and atraumatic.     Right Ear: Tympanic membrane normal.     Left Ear: Tympanic membrane normal.  Eyes:     Pupils: Pupils are equal, round, and reactive to light.  Neck:     Thyroid: No thyromegaly.  Cardiovascular:     Rate and Rhythm: Normal rate and regular rhythm.     Heart sounds: Normal heart sounds. No murmur heard.   Pulmonary:     Effort: Pulmonary effort is normal. No  respiratory distress.     Breath sounds: Normal breath sounds. No wheezing.  Abdominal:     General: Bowel sounds are normal. There is no distension.     Palpations: Abdomen is soft.     Tenderness: There is no abdominal tenderness.  Musculoskeletal:        General: No tenderness. Normal range of motion.     Cervical back: Normal range of motion and neck supple.  Skin:    General: Skin is warm and dry.     Findings: Rash (erythemas rash on bilateral eye lids) present.  Neurological:     Mental Status: She is alert and oriented to person, place, and time.     Cranial Nerves: No cranial nerve deficit.     Deep Tendon Reflexes: Reflexes are normal and symmetric.  Psychiatric:        Behavior: Behavior normal.        Thought Content: Thought content normal.        Judgment: Judgment normal.     BP 131/87   Pulse (!) 104   Temp 97.7 F (36.5 C) (Temporal)   Ht 5' (1.524 m)   Wt (!) 306 lb 6.4 oz (139 kg)   SpO2 95%   BMI 59.84 kg/m        Assessment & Plan:  Barbara SMOLA comes in today with chief complaint of Annual Exam (pap)   Diagnosis and orders addressed:  1. Annual physical exam - CMP14+EGFR - CBC with Differential/Platelet - Lipid panel - TSH - DG Knee 1-2 Views Right; Future - Hepatitis C antibody - VITAMIN D 25 Hydroxy (Vit-D Deficiency, Fractures)  2. Mild asthma without complication, unspecified whether persistent - CMP14+EGFR - CBC with Differential/Platelet  3. OSA (obstructive sleep apnea) - CMP14+EGFR - CBC with Differential/Platelet  4. Obesity, morbid ([place]) - CMP14+EGFR - CBC with Differential/Platelet  5. Vitamin D deficiency - CMP14+EGFR - CBC with Differential/Platelet - VITAMIN D 25 Hydroxy (Vit-D Deficiency, Fractures)  6. Acute pain of right knee - CMP14+EGFR - CBC with Differential/Platelet - diclofenac (VOLTAREN) 75 MG EC tablet; Take 1 tablet (75 mg total) by mouth 2 (two) times daily.  Dispense: 60 tablet; Refill: 2  7.  Eyelid dermatitis, allergic/contact - Crisaborole (EUCRISA) 2 % OINT; Apply 1 application topically in the morning and at bedtime.  Dispense: 100 g; Refill: 1 - CMP14+EGFR - CBC with Differential/Platelet  8. Need for hepatitis C screening test - CMP14+EGFR - CBC with Differential/Platelet - Hepatitis C antibody  9. Gynecologic exam normal - CMP14+EGFR - CBC with Differential/Platelet - Cytology - PAP([place])   Labs pending Health Maintenance reviewed Diet and exercise encouraged  Follow up plan: 1 year    Evelina Dun, FNP

## 2020-04-20 NOTE — Patient Instructions (Signed)
Acute Knee Pain, Adult Acute knee pain is sudden and may be caused by damage, swelling, or irritation of the muscles and tissues that support your knee. The injury may result from:  A fall.  An injury to your knee from twisting motions.  A hit to the knee.  Infection. Acute knee pain may go away on its own with time and rest. If it does not, your health care provider may order tests to find the cause of the pain. These may include:  Imaging tests, such as an X-ray, MRI, or ultrasound.  Joint aspiration. In this test, fluid is removed from the knee.  Arthroscopy. In this test, a lighted tube is inserted into the knee and an image is projected onto a TV screen.  Biopsy. In this test, a sample of tissue is removed from the body and studied under a microscope. Follow these instructions at home: Pay attention to any changes in your symptoms. Take these actions to relieve your pain. If you have a knee sleeve or brace:   Wear the sleeve or brace as told by your health care provider. Remove it only as told by your health care provider.  Loosen the sleeve or brace if your toes tingle, become numb, or turn cold and blue.  Keep the sleeve or brace clean.  If the sleeve or brace is not waterproof: ? Do not let it get wet. ? Cover it with a watertight covering when you take a bath or shower. Activity  Rest your knee.  Do not do things that cause pain or make pain worse.  Avoid high-impact activities or exercises, such as running, jumping rope, or doing jumping jacks.  Work with a physical therapist to make a safe exercise program, as recommended by your health care provider. Do exercises as told by your physical therapist. Managing pain, stiffness, and swelling   If directed, put ice on the knee: ? Put ice in a plastic bag. ? Place a towel between your skin and the bag. ? Leave the ice on for 20 minutes, 2-3 times a day.  If directed, use an elastic bandage to put pressure  (compression) on your injured knee. This may control swelling, give support, and help with discomfort. General instructions  Take over-the-counter and prescription medicines only as told by your health care provider.  Raise (elevate) your knee above the level of your heart when you are sitting or lying down.  Sleep with a pillow under your knee.  Do not use any products that contain nicotine or tobacco, such as cigarettes, e-cigarettes, and chewing tobacco. These can delay healing. If you need help quitting, ask your health care provider.  If you are overweight, work with your health care provider and a dietitian to set a weight-loss goal that is healthy and reasonable for you. Extra weight can put pressure on your knee.  Keep all follow-up visits as told by your health care provider. This is important. Contact a health care provider if:  Your knee pain continues, changes, or gets worse.  You have a fever along with knee pain.  Your knee feels warm to the touch.  Your knee buckles or locks up. Get help right away if:  Your knee swells, and the swelling becomes worse.  You cannot move your knee.  You have severe pain in your knee. Summary  Acute knee pain can be caused by a fall, an injury, an infection, or damage, swelling, or irritation of the tissues that support your knee.  Your health care provider may perform tests to find out the cause of the pain.  Pay attention to any changes in your symptoms. Relieve your pain with rest, medicines, light activity, and use of ice.  Get help if your pain continues or becomes worse, your knee swells, or you cannot move your knee. This information is not intended to replace advice given to you by your health care provider. Make sure you discuss any questions you have with your health care provider. Document Revised: 04/04/2018 Document Reviewed: 04/04/2018 Elsevier Patient Education  [place].

## 2020-04-21 LAB — CBC WITH DIFFERENTIAL/PLATELET
Basophils Absolute: 0.1 10*3/uL (ref 0.0–0.2)
Basos: 1 %
EOS (ABSOLUTE): 0.2 10*3/uL (ref 0.0–0.4)
Eos: 2 %
Hematocrit: 46.2 % (ref 34.0–46.6)
Hemoglobin: 15.1 g/dL (ref 11.1–15.9)
Immature Grans (Abs): 0 10*3/uL (ref 0.0–0.1)
Immature Granulocytes: 0 %
Lymphocytes Absolute: 2.7 10*3/uL (ref 0.7–3.1)
Lymphs: 29 %
MCH: 27.9 pg (ref 26.6–33.0)
MCHC: 32.7 g/dL (ref 31.5–35.7)
MCV: 85 fL (ref 79–97)
Monocytes Absolute: 0.6 10*3/uL (ref 0.1–0.9)
Monocytes: 6 %
Neutrophils Absolute: 5.9 10*3/uL (ref 1.4–7.0)
Neutrophils: 62 %
Platelets: 413 10*3/uL (ref 150–450)
RBC: 5.41 x10E6/uL — ABNORMAL HIGH (ref 3.77–5.28)
RDW: 13 % (ref 11.7–15.4)
WBC: 9.3 10*3/uL (ref 3.4–10.8)

## 2020-04-21 LAB — LIPID PANEL
Chol/HDL Ratio: 3.3 ratio (ref 0.0–4.4)
Cholesterol, Total: 162 mg/dL (ref 100–199)
HDL: 49 mg/dL (ref >39)
LDL Chol Calc (NIH): 94 mg/dL (ref 0–99)
Triglycerides: 101 mg/dL (ref 0–149)
VLDL Cholesterol Cal: 19 mg/dL (ref 5–40)

## 2020-04-21 LAB — CMP14+EGFR
ALT: 20 IU/L (ref 0–32)
AST: 21 IU/L (ref 0–40)
Albumin/Globulin Ratio: 1.5 (ref 1.2–2.2)
Albumin: 4.1 g/dL (ref 3.8–4.8)
Alkaline Phosphatase: 89 IU/L (ref 48–121)
BUN/Creatinine Ratio: 14 (ref 9–23)
BUN: 12 mg/dL (ref 6–24)
Bilirubin Total: 0.5 mg/dL (ref 0.0–1.2)
CO2: 23 mmol/L (ref 20–29)
Calcium: 9.8 mg/dL (ref 8.7–10.2)
Chloride: 100 mmol/L (ref 96–106)
Creatinine, Ser: 0.87 mg/dL (ref 0.57–1.00)
GFR calc Af Amer: 92 mL/min/{1.73_m2} (ref >59)
GFR calc non Af Amer: 80 mL/min/{1.73_m2} (ref >59)
Globulin, Total: 2.7 g/dL (ref 1.5–4.5)
Glucose: 89 mg/dL (ref 65–99)
Potassium: 4.7 mmol/L (ref 3.5–5.2)
Sodium: 136 mmol/L (ref 134–144)
Total Protein: 6.8 g/dL (ref 6.0–8.5)

## 2020-04-21 LAB — VITAMIN D 25 HYDROXY (VIT D DEFICIENCY, FRACTURES): Vit D, 25-Hydroxy: 13.5 ng/mL — ABNORMAL LOW (ref 30.0–100.0)

## 2020-04-21 LAB — HEPATITIS C ANTIBODY: Hep C Virus Ab: <0.1 s/co ratio (ref 0.0–0.9)

## 2020-04-21 LAB — TSH: TSH: 2.32 u[IU]/mL (ref 0.450–4.500)

## 2020-04-22 ENCOUNTER — Other Ambulatory Visit: Payer: Self-pay | Admitting: Family

## 2020-04-22 LAB — CYTOLOGY - PAP
Adequacy: ABSENT
Comment: NEGATIVE
Diagnosis: NEGATIVE
High risk HPV: NEGATIVE

## 2020-04-22 MED ORDER — VITAMIN D (ERGOCALCIFEROL) 1.25 MG (50000 UNIT) PO CAPS
50000.0000 [IU] | ORAL_CAPSULE | ORAL | 3 refills | Status: DC
Start: 2020-04-22 — End: 2021-10-04

## 2020-04-29 ENCOUNTER — Telehealth: Payer: Self-pay | Admitting: Radiology

## 2020-04-29 ENCOUNTER — Telehealth: Payer: Self-pay | Admitting: *Deleted

## 2020-04-29 ENCOUNTER — Telehealth (INDEPENDENT_AMBULATORY_CARE_PROVIDER_SITE_OTHER): Payer: BC Managed Care – PPO | Admitting: Cardiology

## 2020-04-29 ENCOUNTER — Encounter: Payer: Self-pay | Admitting: Cardiology

## 2020-04-29 ENCOUNTER — Other Ambulatory Visit: Payer: Self-pay

## 2020-04-29 ENCOUNTER — Ambulatory Visit (INDEPENDENT_AMBULATORY_CARE_PROVIDER_SITE_OTHER): Payer: BC Managed Care – PPO

## 2020-04-29 VITALS — HR 84 | Ht 60.0 in | Wt 306.0 lb

## 2020-04-29 VITALS — BP 129/92 | HR 121 | Ht 60.0 in | Wt 306.0 lb

## 2020-04-29 DIAGNOSIS — R002 Palpitations: Secondary | ICD-10-CM | POA: Diagnosis not present

## 2020-04-29 DIAGNOSIS — G4733 Obstructive sleep apnea (adult) (pediatric): Secondary | ICD-10-CM

## 2020-04-29 NOTE — Progress Notes (Addendum)
Virtual Visit via Telephone Note   This visit type was conducted due to national recommendations for restrictions regarding the COVID-19 Pandemic (e.g. social distancing) in an effort to limit this patient's exposure and mitigate transmission in our community.  Due to her co-morbid illnesses, this patient is at least at moderate risk for complications without adequate follow up.  This format is felt to be most appropriate for this patient at this time.  The patient did not have access to video technology/had technical difficulties with video requiring transitioning to audio format only (telephone).  All issues noted in this document were discussed and addressed.  No physical exam could be performed with this format.  Please refer to the patient's chart for her  consent to telehealth for [facility].  Evaluation Performed:  Follow-up visit  This visit type was conducted due to national recommendations for restrictions regarding the COVID-19 Pandemic (e.g. social distancing).  This format is felt to be most appropriate for this patient at this time.  All issues noted in this document were discussed and addressed.  No physical exam was performed (except for noted visual exam findings with Video Visits).  Please refer to the patient's chart (MyChart message for video visits and phone note for telephone visits) for the patient's consent to telehealth for [facility].  Date:  04/29/2020   ID:  Barbara, Hinton 1971/04/22, MRN [id number]  Patient Location:  Home  Provider location:   [place]  PCP:  Sharion Balloon, FNP  Sleep Medicine:  Fransico Him, MD Electrophysiologist:  None   Chief Complaint:  OSA  History of Present Illness:    Barbara Hinton is a 49 y.o. female who presents via audio/video conferencing for a telehealth visit today.    Barbara Hinton is a 49 y.o. female with a hx of obesity, snoring and excessive daytime sleepiness with an ESS of 16 who was referred by  Dr. Tamala Julian for sleep study.  Her PSG showed mild OSA overall with an AHI of 9.8/hr but severe during REM sleep with an AHI of 53.8/hr and nocturnal hypoxemia with O2 desats as low as 81%.  She underwent CPAP titration to 10cm H2O.  She is doing well with her CPAP device and thinks that she has gotten used to it.  She tolerates the mask and feels the pressure is adequate.  Since going on CPAP she feels rested in the am and has no significant daytime sleepiness.  She denies any significant mouth or nasal dryness or nasal congestion.  She does not think that he snores.  She also tells me that occasionally she has some palpitations that last a few minutes at a time and occur several times weekly.  She denies any dizziness or syncope.  She has chronic DOE due to asthma.  She drinks but has been trying to cut back on caffeine over the past few weeks that has helped.  The patient does not have symptoms concerning for COVID-19 infection (fever, chills, cough, or new shortness of breath).    Prior CV studies:   The following studies were reviewed today:  PAP compliance download  Past Medical History:  Diagnosis Date  . Asthma   . OSA (obstructive sleep apnea) 12/04/2017   Mild with AHI overall 9.8/hr but during REM sleep severe with AHI 53.8/hr and nocturnal hypoxemia now on CPAP at 10cm H2O.     No past surgical history on file.   Current Meds  Medication Sig  .  albuterol (VENTOLIN HFA) 108 (90 Base) MCG/ACT inhaler Inhale 2 puffs into the lungs every 6 (six) hours as needed for wheezing or shortness of breath.  Marland Kitchen aspirin 81 MG tablet Take 81 mg by mouth 2 (two) times a week.  . Fluticasone-Salmeterol (ADVAIR) 100-50 MCG/DOSE AEPB Inhale 1 puff into the lungs 2 (two) times daily.  . Vitamin D, Ergocalciferol, (DRISDOL) 1.25 MG (50000 UNIT) CAPS capsule Take 1 capsule (50,000 Units total) by mouth every 7 (seven) days.     Allergies:   Patient has no known allergies.   Social History   Tobacco  Use  . Smoking status: Passive Smoke Exposure - Never Smoker  . Smokeless tobacco: Never Used  Substance Use Topics  . Alcohol use: No  . Drug use: No     Family Hx: The patient's family history includes Diabetes in her father and mother; Heart attack in her maternal grandfather and paternal grandfather; Hypertension in her brother and father.  ROS:   Please see the history of present illness.     All other systems reviewed and are negative.   Labs/Other Tests and Data Reviewed:    Recent Labs: 04/20/2020: ALT 20; BUN 12; Creatinine, Ser 0.87; Hemoglobin 15.1; Platelets 413; Potassium 4.7; Sodium 136; TSH 2.320   Recent Lipid Panel Lab Results  Component Value Date/Time   CHOL 162 04/20/2020 08:30 AM   TRIG 101 04/20/2020 08:30 AM   HDL 49 04/20/2020 08:30 AM   CHOLHDL 3.3 04/20/2020 08:30 AM   LDLCALC 94 04/20/2020 08:30 AM    Wt Readings from Last 3 Encounters:  04/29/20 (!) 306 lb (138.8 kg)  04/20/20 (!) 306 lb 6.4 oz (139 kg)  01/18/18 297 lb 9.6 oz (135 kg)     Objective:    Vital Signs:  BP (!) 129/92   Pulse (!) 121   Ht 5' (1.524 m)   Wt (!) 306 lb (138.8 kg)   BMI 59.76 kg/m      ASSESSMENT & PLAN:    1.  OSA - The patient is tolerating PAP therapy well without any problems. The PAP download was reviewed today and showed 100% compliannt in using more than 4 hours nightly but it will not show me the AHI.  The patient has been using and benefiting from PAP use and will continue to benefit from therapy.   2.  Morbid Obesity -I have encouraged her to get into a routine exercise program and cut back on carbs and portions.  -I will refer her to [facility] and [place]  3.  Palpitations -the patient has been having palpitations episodically and today her BP cuff read that her HR was irregular -I will have her come in for EKG today to make sure she is not in afib and then event monitor  COVID-19 Education: The signs and symptoms of COVID-19  were discussed with the patient and how to seek care for testing (follow up with PCP or arrange E-visit).  The importance of social distancing was discussed today.  Patient Risk:   After full review of this patient's clinical status, I feel that they are at least moderate risk at this time.  Time:   Today, I have spent 15 minutes on telemedicine discussing medical problems including OSA and morbid obesity and reviewing patient's chart including PAP compliance download.  Medication Adjustments/Labs and Tests Ordered: Current medicines are reviewed at length with the patient today.  Concerns regarding medicines are outlined above.  Tests Ordered: No orders  of the defined types were placed in this encounter.  Medication Changes: No orders of the defined types were placed in this encounter.   Disposition:  Follow up in 1 year(s)  Signed, Fransico Him, MD  04/29/2020 9:04 AM    [place]

## 2020-04-29 NOTE — Telephone Encounter (Signed)
-----   Message from Sueanne Margarita, MD sent at 04/29/2020  9:04 AM EDT ----- I was not able to get patients AHI on download please address.  Followup in 1 year.

## 2020-04-29 NOTE — Telephone Encounter (Signed)
-----   Message from Sueanne Margarita, MD sent at 04/29/2020  5:39 PM EDT ----- Regarding: RE: DOWNLOAD Good AHI ----- Message ----- From: Freada Bergeron, CMA Sent: 04/29/2020   5:23 PM EDT To: Sueanne Margarita, MD Subject: DOWNLOAD                                       Stebner#, Tyeasha 03/30/2020 - 04/28/2020 DOB: 1971/05/03 Age: 48 years [address] [place], [place] Phone: [phone] Email: [email or web address] Compliance Report Usage 03/30/2020 - 04/28/2020 Usage days 30/30 days (100%) >= 4 hours 30 days (100%) < 4 hours 0 days (0%) Usage hours 241 hours 29 minutes Average usage (total days) 8 hours 3 minutes Average usage (days used) 8 hours 3 minutes Median usage (days used) 8 hours 6 minutes Total used hours (value since last reset - 04/28/2020) 7,453 hours AirSense 10 AutoSet Serial number [id number] Mode CPAP Set pressure 10 cmH2O EPR Fulltime EPR level 3 Therapy Leaks - L/min Median: 0.0 95th percentile: 5.1 Maximum: 13.4 Events per hour AI: 0.2 HI: 0.4 AHI: 0.6 Apnea Index Central: 0.0 Obstructive: 0.2 Unknown: 0.0 RERA Index 1.3 Cheyne-Stokes respiration (average duration per night) 0 minutes (0%) Usage - hours Printed on 04/29/2020 - ResMed AirView version 4.26.0-2.0 Page 1 of 1 ----- Message ----- From: Sueanne Margarita, MD Sent: 04/29/2020   9:04 AM EDT To: Freada Bergeron, CMA  I was not able to get patients AHI on download please address.  Followup in 1 year.

## 2020-04-29 NOTE — Progress Notes (Signed)
1.) Reason for visit: EKG for palpitations  2.) Name of MD requesting visit: Dr. Radford Pax  3.) Assessment and plan per MD: EKG reviewed by DOD, Dr. Curt Bears - normal EKG. No changes.  Patient will wear 30 day event monitor per Dr. Radford Pax.

## 2020-04-29 NOTE — Patient Instructions (Signed)
Medication Instructions:  Your physician recommends that you continue on your current medications as directed. Please refer to the Current Medication list given to you today.  *If you need a refill on your cardiac medications before your next appointment, please call your pharmacy*  Testing/Procedures: You physician is requesting that you have an EKG done. Please come into to office today at 11am for a nurse visit.   Your physician has recommended that you wear an event monitor. Event monitors are medical devices that record the heart's electrical activity. Doctors most often [place] these monitors to diagnose arrhythmias. Arrhythmias are problems with the speed or rhythm of the heartbeat. The monitor is a small, portable device. You can wear one while you do your normal daily activities. This is usually used to diagnose what is causing palpitations/syncope (passing out).   Follow-Up: At [facility], you and your health needs are our priority.  As part of our continuing mission to provide you with exceptional heart care, we have created designated Provider Care Teams.  These Care Teams include your primary Cardiologist (physician) and Advanced Practice Providers (APPs -  Physician Assistants and Nurse Practitioners) who all work together to provide you with the care you need, when you need it.   Your next appointment:   1 year(s)  The format for your next appointment:   Virtual Visit   Provider:   Fransico Him, MD

## 2020-04-29 NOTE — Telephone Encounter (Signed)
Informed patient of compliance results and verbalized understanding was indicated. Patient is aware and agreeable to AHI being within range at 0.6. Patient is aware and agreeable to being in compliance with machine usage. Patient is aware and agreeable to no change in current pressures

## 2020-04-29 NOTE — Patient Instructions (Signed)
EKG was normal.  Medication Instructions:  Your physician recommends that you continue on your current medications as directed. Please refer to the Current Medication list given to you today.  *If you need a refill on your cardiac medications before your next appointment, please call your pharmacy*   Follow-Up: At [facility], you and your health needs are our priority.  As part of our continuing mission to provide you with exceptional heart care, we have created designated Provider Care Teams.  These Care Teams include your primary Cardiologist (physician) and Advanced Practice Providers (APPs -  Physician Assistants and Nurse Practitioners) who all work together to provide you with the care you need, when you need it.

## 2020-04-29 NOTE — Telephone Encounter (Signed)
D/l sent to Dr Radford Pax for review: Newman Nip*, K[place] 03/30/2020 - 04/28/2020 DOB: 07-12-1971 Age: 49 years [address] [place], [place] Phone: [phone] Email: [email or web address] Compliance Report Usage 03/30/2020 - 04/28/2020 Usage days 30/30 days (100%) >= 4 hours 30 days (100%) < 4 hours 0 days (0%) Usage hours 241 hours 29 minutes Average usage (total days) 8 hours 3 minutes Average usage (days used) 8 hours 3 minutes Median usage (days used) 8 hours 6 minutes Total used hours (value since last reset - 04/28/2020) 7,453 hours AirSense 10 AutoSet Serial number [id number] Mode CPAP Set pressure 10 cmH2O EPR Fulltime EPR level 3 Therapy Leaks - L/min Median: 0.0 95th percentile: 5.1 Maximum: 13.4 Events per hour AI: 0.2 HI: 0.4 AHI: 0.6 Apnea Index Central: 0.0 Obstructive: 0.2 Unknown: 0.0 RERA Index 1.3 Cheyne-Stokes respiration (average duration per night) 0 minutes (0%) Usage - hours Printed on 04/29/2020 - ResMed AirView version 4.26.0-2.0 Page 1 of 1

## 2020-04-29 NOTE — Telephone Encounter (Signed)
Enrolled patient for a 30 day [employer] Event monitor to be mailed to patients home.  

## 2020-04-29 NOTE — Addendum Note (Signed)
Addended by: Antonieta Iba on: 04/29/2020 09:23 AM   Modules accepted: Orders

## 2020-05-09 ENCOUNTER — Encounter (INDEPENDENT_AMBULATORY_CARE_PROVIDER_SITE_OTHER): Payer: BC Managed Care – PPO

## 2020-05-09 DIAGNOSIS — R002 Palpitations: Secondary | ICD-10-CM

## 2020-05-18 ENCOUNTER — Telehealth: Payer: Self-pay | Admitting: Family

## 2020-05-18 MED ORDER — FLUTICASONE-SALMETEROL 100-50 MCG/DOSE IN AEPB: 1.0000 | INHALATION_SPRAY | Freq: Two times a day (BID) | RESPIRATORY_TRACT | 1 refills | Status: DC

## 2020-05-18 NOTE — Telephone Encounter (Signed)
90 day supply of Advair sent over per pt request.

## 2020-06-09 ENCOUNTER — Telehealth: Payer: Self-pay | Admitting: Cardiology

## 2020-06-09 ENCOUNTER — Telehealth: Payer: Self-pay

## 2020-06-09 DIAGNOSIS — R002 Palpitations: Secondary | ICD-10-CM

## 2020-06-09 NOTE — Telephone Encounter (Signed)
-----   Message from Sueanne Margarita, MD sent at 06/09/2020  1:46 PM EDT ----- Heart monitor showed occasional extra heart beats from the bottom of her heart which are benign.  If they are bothersome we could try medication to suppress them but they are a normal finding and are nothing to worry about.  Recent labs were normal including TSH.  Please get a repeat echo to make sure LVF is still normal

## 2020-06-09 NOTE — Telephone Encounter (Signed)
The patient has been notified of the result and verbalized understanding.  All questions (if any) were answered. Patient states that palpitations are not bothersome and she would not like to start on any medication at this time. Echocardiogram has been ordered and scheduled.

## 2020-06-09 NOTE — Telephone Encounter (Signed)
Patient is returning [place] call, transferred call to [facility].

## 2020-06-28 ENCOUNTER — Ambulatory Visit (HOSPITAL_COMMUNITY): Payer: BC Managed Care – PPO | Attending: Cardiology

## 2020-06-28 ENCOUNTER — Other Ambulatory Visit: Payer: Self-pay

## 2020-06-28 DIAGNOSIS — R002 Palpitations: Secondary | ICD-10-CM | POA: Diagnosis not present

## 2020-06-28 LAB — ECHOCARDIOGRAM COMPLETE
Area-P 1/2: 3.3 cm2
S' Lateral: 2.7 cm

## 2020-06-28 MED ORDER — PERFLUTREN LIPID MICROSPHERE
1.0000 mL | INTRAVENOUS | Status: AC | PRN
  Administered 2020-06-28: 2 mL via INTRAVENOUS

## 2020-07-08 DIAGNOSIS — Z20822 Contact with and (suspected) exposure to covid-19: Secondary | ICD-10-CM | POA: Diagnosis not present

## 2020-11-10 ENCOUNTER — Telehealth: Payer: Self-pay

## 2020-11-10 ENCOUNTER — Other Ambulatory Visit: Payer: Self-pay | Admitting: Family

## 2020-11-10 MED ORDER — FLUTICASONE-SALMETEROL 100-50 MCG/DOSE IN AEPB
1.0000 | INHALATION_SPRAY | Freq: Two times a day (BID) | RESPIRATORY_TRACT | 0 refills | Status: DC
Start: 2020-11-10 — End: 2021-10-04

## 2020-11-10 NOTE — Telephone Encounter (Signed)
Rx sent- patient aware.  

## 2021-03-22 ENCOUNTER — Ambulatory Visit (INDEPENDENT_AMBULATORY_CARE_PROVIDER_SITE_OTHER): Payer: Commercial Managed Care - PPO | Admitting: Family

## 2021-03-22 ENCOUNTER — Encounter: Payer: Self-pay | Admitting: Family

## 2021-03-22 DIAGNOSIS — U071 COVID-19: Secondary | ICD-10-CM | POA: Diagnosis not present

## 2021-03-22 MED ORDER — PREDNISONE 10 MG (21) PO TBPK: ORAL_TABLET | ORAL | 0 refills | Status: DC

## 2021-03-22 NOTE — Progress Notes (Signed)
   Virtual Visit  Note Due to COVID-19 pandemic this visit was conducted virtually. This visit type was conducted due to national recommendations for restrictions regarding the COVID-19 Pandemic (e.g. social distancing, sheltering in place) in an effort to limit this patient's exposure and mitigate transmission in our community. All issues noted in this document were discussed and addressed.  A physical exam was not performed with this format.  I connected with Barbara Hinton on 03/22/21 at 12:15 pm  by telephone and verified that I am speaking with the correct person using two identifiers. Barbara Hinton is currently located at home and no one is currently with her during visit. The provider, Evelina Dun, FNP is located in their office at time of visit.  I discussed the limitations, risks, security and privacy concerns of performing an evaluation and management service by telephone and the availability of in person appointments. I also discussed with the patient that there may be a patient responsible charge related to this service. The patient expressed understanding and agreed to proceed.   History and Present Illness:   Pt presents to the office today with headache, ear pain that started yesterday. She reports her husband was just told today he was COVID positive.  Otalgia  There is pain in the right ear. This is a new problem. The current episode started yesterday. There has been no fever. The pain is at a severity of 6/10. Associated symptoms include coughing and headaches. Pertinent negatives include no ear discharge, rhinorrhea or sore throat. She has tried NSAIDs for the symptoms. The treatment provided mild relief.     Review of Systems  HENT: Positive for ear pain. Negative for ear discharge, rhinorrhea and sore throat.   Respiratory: Positive for cough.   Neurological: Positive for headaches.  All other systems reviewed and are negative.    Observations/Objective: No SOB or  distress noted  Assessment and Plan: 1. COVID-19 virus detected COVID positive, rest, force fluids, tylenol as needed, Quarantine for at least 5 days and fever free, report any worsening symptoms such as increased shortness of breath, swelling, or continued high fevers.  - predniSONE (STERAPRED UNI-PAK 21 TAB) 10 MG (21) TBPK tablet; Use as directed  Dispense: 21 tablet; Refill: 0 - MyChart COVID-19 home monitoring program; Future      I discussed the assessment and treatment plan with the patient. The patient was provided an opportunity to ask questions and all were answered. The patient agreed with the plan and demonstrated an understanding of the instructions.   The patient was advised to call back or seek an in-person evaluation if the symptoms worsen or if the condition fails to improve as anticipated.  The above assessment and management plan was discussed with the patient. The patient verbalized understanding of and has agreed to the management plan. Patient is aware to call the clinic if symptoms persist or worsen. Patient is aware when to return to the clinic for a follow-up visit. Patient educated on when it is appropriate to go to the emergency department.   Time call ended:  2:26 pm   I provided 11 minutes of  non face-to-face time during this encounter.    Evelina Dun, FNP

## 2021-03-23 ENCOUNTER — Other Ambulatory Visit: Payer: Self-pay | Admitting: Family

## 2021-06-03 ENCOUNTER — Ambulatory Visit (INDEPENDENT_AMBULATORY_CARE_PROVIDER_SITE_OTHER): Payer: Commercial Managed Care - PPO | Admitting: Nurse Practitioner

## 2021-06-03 ENCOUNTER — Other Ambulatory Visit: Payer: Self-pay

## 2021-06-03 VITALS — BP 114/76 | HR 86 | Temp 97.1°F | Ht 60.0 in | Wt 313.0 lb

## 2021-06-03 DIAGNOSIS — M6208 Separation of muscle (nontraumatic), other site: Secondary | ICD-10-CM | POA: Diagnosis not present

## 2021-06-03 DIAGNOSIS — K2100 Gastro-esophageal reflux disease with esophagitis, without bleeding: Secondary | ICD-10-CM | POA: Diagnosis not present

## 2021-06-03 MED ORDER — OMEPRAZOLE 20 MG PO CPDR: 20.0000 mg | DELAYED_RELEASE_CAPSULE | Freq: Every day | ORAL | 3 refills | Status: DC

## 2021-06-03 NOTE — Patient Instructions (Addendum)
Gastroesophageal Reflux Disease, Adult  Gastroesophageal reflux (GER) happens when acid from the stomach flows up into the tube that connects the mouth and the stomach (esophagus). Normally, food travels down the esophagus and stays in the stomach to be digested. With GER, food and stomach acid sometimes move back up into theesophagus. You may have a disease called gastroesophageal reflux disease (GERD) if the reflux: Happens often. Causes frequent or very bad symptoms. Causes problems such as damage to the esophagus. When this happens, the esophagus becomes sore and swollen. Over time, GERD can make small holes (ulcers) in the lining of the esophagus. What are the causes? This condition is caused by a problem with the muscle between the esophagus and the stomach. When this muscle is weak or not normal, it does not close properlyto keep food and acid from coming back up from the stomach. The muscle can be weak because of: Tobacco use. Pregnancy. Having a certain type of hernia (hiatal hernia). Alcohol use. Certain foods and drinks, such as coffee, chocolate, onions, and peppermint. What increases the risk? Being overweight. Having a disease that affects your connective tissue. Taking NSAIDs, such a ibuprofen. What are the signs or symptoms? Heartburn. Difficult or painful swallowing. The feeling of having a lump in the throat. A bitter taste in the mouth. Bad breath. Having a lot of saliva. Having an upset or bloated stomach. Burping. Chest pain. Different conditions can cause chest pain. Make sure you see your doctor if you have chest pain. Shortness of breath or wheezing. A long-term cough or a cough at night. Wearing away of the surface of teeth (tooth enamel). Weight loss. How is this treated? Making changes to your diet. Taking medicine. Having surgery. Treatment will depend on how bad your symptoms are. Follow these instructions at home: Eating and drinking  Follow a  diet as told by your doctor. You may need to avoid foods and drinks such as: Coffee and tea, with or without caffeine. Drinks that contain alcohol. Energy drinks and sports drinks. Bubbly (carbonated) drinks or sodas. Chocolate and cocoa. Peppermint and mint flavorings. Garlic and onions. Horseradish. Spicy and acidic foods. These include peppers, chili powder, curry powder, vinegar, hot sauces, and BBQ sauce. Citrus fruit juices and citrus fruits, such as oranges, lemons, and limes. Tomato-based foods. These include red sauce, chili, salsa, and pizza with red sauce. Fried and fatty foods. These include donuts, french fries, potato chips, and high-fat dressings. High-fat meats. These include hot dogs, rib eye steak, sausage, ham, and bacon. High-fat dairy items, such as whole milk, butter, and cream cheese. Eat small meals often. Avoid eating large meals. Avoid drinking large amounts of liquid with your meals. Avoid eating meals during the 2-3 hours before bedtime. Avoid lying down right after you eat. Do not exercise right after you eat.  Lifestyle  Do not smoke or use any products that contain nicotine or tobacco. If you need help quitting, ask your doctor. Try to lower your stress. If you need help doing this, ask your doctor. If you are overweight, lose an amount of weight that is healthy for you. Ask your doctor about a safe weight loss goal.  General instructions Pay attention to any changes in your symptoms. Take over-the-counter and prescription medicines only as told by your doctor. Do not take aspirin, ibuprofen, or other NSAIDs unless your doctor says it is okay. Wear loose clothes. Do not wear anything tight around your waist. Raise (elevate) the head of your bed about  6 inches (15 cm). You may need to use a wedge to do this. Avoid bending over if this makes your symptoms worse. Keep all follow-up visits. Contact a doctor if: You have new symptoms. You lose weight and  you do not know why. You have trouble swallowing or it hurts to swallow. You have wheezing or a cough that keeps happening. You have a hoarse voice. Your symptoms do not get better with treatment. Get help right away if: You have sudden pain in your arms, neck, jaw, teeth, or back. You suddenly feel sweaty, dizzy, or light-headed. You have chest pain or shortness of breath. You vomit and the vomit is green, yellow, or black, or it looks like blood or coffee grounds. You faint. Your poop (stool) is red, bloody, or black. You cannot swallow, drink, or eat. These symptoms may represent a serious problem that is an emergency. Do not wait to see if the symptoms will go away. Get medical help right away. Call your local emergency services (911 in the [place] Do not drive yourself to the hospital. Summary If a person has gastroesophageal reflux disease (GERD), food and stomach acid move back up into the esophagus and cause symptoms or problems such as damage to the esophagus. Treatment will depend on how bad your symptoms are. Follow a diet as told by your doctor. Take all medicines only as told by your doctor. This information is not intended to replace advice given to you by your health care provider. Make sure you discuss any questions you have with your healthcare provider. Document Revised: 05/03/2020 Document Reviewed: 05/03/2020 Elsevier Patient Education  [place]  Diastasis recti is a condition in which the muscles of the abdomen (rectus abdominis muscles) become thin and separate. The result is a wider space between the muscles of the right and left abdomen (abdominal muscles). This wider space between the muscles may cause a bulge in the middle of the abdomen. This bulge may be noticed when a person is straining or when he or shesits up after lying down. Diastasis recti can affect men and women. It is most common among pregnant women, babies, people with  obesity, and people who have had abdominal surgery.Exercise or surgery may help correct this condition. What are the causes? Common causes of this condition include: Pregnancy. As the uterus grows in size, it puts pressure on the abdominal muscles, causing the muscles to separate. Obesity. Excess fat puts pressure on abdominal muscles. Weight lifting. Some exercises of the abdomen. Advanced age. Genetics. Having had surgery on the abdomen before. What increases the risk? This condition is more likely to develop in: Women. Newborns, especially newborns who are born early (prematurely). What are the signs or symptoms? Common symptoms of this condition include: A bulge in the middle of your abdomen. You will notice it most when you sit up or strain. Pain in your low back, hips, or the area between your hip bones (pelvis). Constipation. Being unable to control when you urinate (urinary incontinence). Bloating. Poor posture. How is this diagnosed? This condition is diagnosed with a physical exam. During the exam, your health care provider will ask you to lie flat on your back and do a crunch or half sit-up. If you have diastasis recti, a bulge will appear lengthwise between your abdominal muscles in the center of your abdomen. Your health care provider will measure the gap between your muscles with one of the following: A medical device used to measure the  space between two objects (caliper). A tape measure. CT scan. Ultrasound. Finger spaces. Your health care provider will measure the space using his or her fingers. How is this treated? If your muscle separation is not too large, you may not need treatment. However, if you are a woman who plans to become pregnant again, you should treat this condition before your next pregnancy. Treatment may include: Physical therapy exercises to strengthen and tighten your abdominal muscles. Lifestyle changes such as weight loss and  exercise. Over-the-counter pain medicines as needed. Surgery to correct the separation. Follow these instructions at home: Activity Return to your normal activities as told by your health care provider. Ask your health care provider what activities are safe for you. Do exercises as told by your health care provider. Make sure you are doing your exercises and movements correctly when lifting weights or doing exercises using your abdominal muscles or the muscles in the center of your body that give stability (core muscles). Proper form can help to prevent this condition from happening again. General instructions If you are overweight, ask your health care provider for help with weight loss. Losing even a small amount of weight can help to improve your diastasis recti. Take over-the-counter or prescription medicines only as told by your health care provider. Do not strain. Straining can make the separation worse. Examples of straining include: Pushing hard to have a bowel movement, such as when you have constipation. Lifting heavy objects or lifting children. Standing up and sitting down. You may need to take these actions to prevent or treat constipation: Drink enough fluid to keep your urine pale yellow. Take over-the-counter or prescription medicines. Eat foods that are high in fiber, such as beans, whole grains, and fresh fruits and vegetables. Limit foods that are high in fat and processed sugars, such as fried or sweet foods. Keep all follow-up visits. This is important. Contact a health care provider if: You notice a new bulge in your abdomen. Get help right away if: You experience severe discomfort in your abdomen. You develop severe abdominal pain along with nausea, vomiting, or a fever. Summary Diastasis recti is a condition in which the muscles of the abdomen (rectus abdominismuscles) become thin and separate. You may notice a bulge in your abdomen because the space has widened  between the muscles of the right and left abdomen. The most common symptom is a bulge in the middle of your abdomen. You will notice it most when you sit up or strain. This condition is diagnosed with a physical exam. If the muscle separation is not too big, you may not need treatment. Otherwise, you may need to do physical therapy or have surgery. This information is not intended to replace advice given to you by your health care provider. Make sure you discuss any questions you have with your healthcare provider. Document Revised: 06/25/2020 Document Reviewed: 06/25/2020 Elsevier Patient Education  [place].

## 2021-06-03 NOTE — Progress Notes (Signed)
Acute Office Visit  Subjective:    Patient ID: Barbara Hinton, female    DOB: 13-Dec-1970, 50 y.o.   MRN: [id number]  Chief Complaint  Patient presents with   Abdominal Pain    Abdominal Pain This is a new problem. The current episode started in the past 7 days. The onset quality is gradual. The problem occurs intermittently. The problem has been unchanged. The pain is located in the periumbilical region. The pain is at a severity of 3/10. The pain is mild. The quality of the pain is aching. The abdominal pain does not radiate. Pertinent negatives include no constipation, fever, nausea or vomiting. Nothing aggravates the pain. The pain is relieved by Nothing. She has tried nothing for the symptoms.    Past Medical History:  Diagnosis Date   Asthma    OSA (obstructive sleep apnea) 12/04/2017   Mild with AHI overall 9.8/hr but during REM sleep severe with AHI 53.8/hr and nocturnal hypoxemia now on CPAP at 10cm H2O.      No past surgical history on file.  Family History  Problem Relation Age of Onset   Diabetes Mother    Diabetes Father    Hypertension Father    Hypertension Brother    Heart attack Maternal Grandfather    Heart attack Paternal Grandfather     Social History   Socioeconomic History   Marital status: Married    Spouse name: Not on file   Number of children: Not on file   Years of education: Not on file   Highest education level: Not on file  Occupational History   Not on file  Tobacco Use   Smoking status: Passive Smoke Exposure - Never Smoker   Smokeless tobacco: Never  Substance and Sexual Activity   Alcohol use: No   Drug use: No   Sexual activity: Not on file  Other Topics Concern   Not on file  Social History Narrative   Not on file   Social Determinants of Health   Financial Resource Strain: Not on file  Food Insecurity: Not on file  Transportation Needs: Not on file  Physical Activity: Not on file  Stress: Not on file  Social  Connections: Not on file  Intimate Partner Violence: Not on file    Outpatient Medications Prior to Visit  Medication Sig Dispense Refill   albuterol (VENTOLIN HFA) 108 (90 Base) MCG/ACT inhaler Inhale 2 puffs into the lungs every 6 (six) hours as needed for wheezing or shortness of breath. 18 g 2   Fluticasone-Salmeterol (ADVAIR) 100-50 MCG/DOSE AEPB Inhale 1 puff into the lungs 2 (two) times daily. (Needs to be seen before next refill) 180 each 0   Vitamin D, Ergocalciferol, (DRISDOL) 1.25 MG (50000 UNIT) CAPS capsule Take 1 capsule (50,000 Units total) by mouth every 7 (seven) days. 12 capsule 3   aspirin 81 MG tablet Take 81 mg by mouth 2 (two) times a week. (Patient not taking: Reported on 06/03/2021)     predniSONE (STERAPRED UNI-PAK 21 TAB) 10 MG (21) TBPK tablet Use as directed 21 tablet 0   No facility-administered medications prior to visit.    No Known Allergies  Review of Systems  Constitutional:  Negative for fever.  HENT: Negative.    Respiratory: Negative.    Gastrointestinal:  Positive for abdominal pain. Negative for constipation, nausea and vomiting.  Genitourinary: Negative.   Skin: Negative.  Negative for rash.  Psychiatric/Behavioral:  The patient is not nervous/anxious.   All other  systems reviewed and are negative.     Objective:    Physical Exam Vitals and nursing note reviewed.  Constitutional:      Appearance: She is well-developed.  HENT:     Head: Normocephalic.  Cardiovascular:     Rate and Rhythm: Normal rate and regular rhythm.  Pulmonary:     Effort: Pulmonary effort is normal.     Breath sounds: Normal breath sounds.  Abdominal:     Palpations: Abdomen is soft.     Tenderness: There is abdominal tenderness in the periumbilical area.  Skin:    Findings: No rash.  Neurological:     Mental Status: She is alert and oriented to person, place, and time.  Psychiatric:        Mood and Affect: Mood normal.        Behavior: Behavior normal.     BP 114/76   Pulse 86   Temp (!) 97.1 F (36.2 C) (Temporal)   Ht 5' (1.524 m)   Wt (!) 313 lb (142 kg)   SpO2 95%   BMI 61.13 kg/m  Wt Readings from Last 3 Encounters:  06/03/21 (!) 313 lb (142 kg)  04/29/20 (!) 306 lb (138.8 kg)  04/29/20 (!) 306 lb (138.8 kg)    Health Maintenance Due  Topic Date Due   COVID-19 Vaccine (1) Never done   TETANUS/TDAP  Never done   COLONOSCOPY (Pts 45-3yr Insurance coverage will need to be confirmed)  Never done    There are no preventive care reminders to display for this patient.   Lab Results  Component Value Date   TSH 2.320 04/20/2020   Lab Results  Component Value Date   WBC 9.3 04/20/2020   HGB 15.1 04/20/2020   HCT 46.2 04/20/2020   MCV 85 04/20/2020   PLT 413 04/20/2020   Lab Results  Component Value Date   NA 136 04/20/2020   K 4.7 04/20/2020   CO2 23 04/20/2020   GLUCOSE 89 04/20/2020   BUN 12 04/20/2020   CREATININE 0.87 04/20/2020   BILITOT 0.5 04/20/2020   ALKPHOS 89 04/20/2020   AST 21 04/20/2020   ALT 20 04/20/2020   PROT 6.8 04/20/2020   ALBUMIN 4.1 04/20/2020   CALCIUM 9.8 04/20/2020   Lab Results  Component Value Date   CHOL 162 04/20/2020   Lab Results  Component Value Date   HDL 49 04/20/2020   Lab Results  Component Value Date   LDLCALC 94 04/20/2020   Lab Results  Component Value Date   TRIG 101 04/20/2020   Lab Results  Component Value Date   CHOLHDL 3.3 04/20/2020   No results found for: HGBA1C     Assessment & Plan:   Problem List Items Addressed This Visit       Digestive   Gastroesophageal reflux disease with esophagitis without hemorrhage    Worsening symptoms of GERD, Prilosec 20 mg tablet by mouth daily, Rx sent to pharmacy, follow-up with worsening unresolved symptoms.  Education provided to patient printed handouts given.         Relevant Medications   omeprazole (PRILOSEC) 20 MG capsule     Musculoskeletal and Integument   Diastasis recti - Primary     Slight tender area found on abdomen that looks more like diastases recti, patient reports this is new in the last few weeks and has been uncomfortable.  Provided education to patient, completed ultrasound results pending.  Advised patient on weight loss and abdominal wall/muscle strengthening  exercises. For worsening pain Tylenol/ibuprofen as needed. Patient verbalized understanding. Patient knows to follow-up with worsening or unresolved symptoms.       Relevant Medications   ibuprofen (ADVIL) 600 MG tablet   Other Relevant Orders   US Abdomen Complete     Meds ordered this encounter  Medications   omeprazole (PRILOSEC) 20 MG capsule    Sig: Take 1 capsule (20 mg total) by mouth daily.    Dispense:  30 capsule    Refill:  3    Order Specific Question:   Supervising Provider    Answer:   Janora Norlander [id number]   ibuprofen (ADVIL) 600 MG tablet    Sig: Take 1 tablet (600 mg total) by mouth every 8 (eight) hours as needed.    Dispense:  30 tablet    Refill:  0    Order Specific Question:   Supervising Provider    Answer:   Janora Norlander [id number]     Ivy Lynn, NP

## 2021-06-05 ENCOUNTER — Encounter: Payer: Self-pay | Admitting: Nurse Practitioner

## 2021-06-05 MED ORDER — IBUPROFEN 600 MG PO TABS: 600.0000 mg | ORAL_TABLET | Freq: Three times a day (TID) | ORAL | 0 refills | Status: DC | PRN

## 2021-06-05 NOTE — Assessment & Plan Note (Signed)
Slight tender area found on abdomen that looks more like diastases recti, patient reports this is new in the last few weeks and has been uncomfortable.  Provided education to patient, completed ultrasound results pending.  Advised patient on weight loss and abdominal wall/muscle strengthening exercises. For worsening pain Tylenol/ibuprofen as needed. Patient verbalized understanding. Patient knows to follow-up with worsening or unresolved symptoms.

## 2021-06-05 NOTE — Assessment & Plan Note (Signed)
Worsening symptoms of GERD, Prilosec 20 mg tablet by mouth daily, Rx sent to pharmacy, follow-up with worsening unresolved symptoms.  Education provided to patient printed handouts given.

## 2021-06-06 ENCOUNTER — Encounter: Payer: Self-pay | Admitting: Nurse Practitioner

## 2021-06-08 ENCOUNTER — Telehealth: Payer: Self-pay | Admitting: Family

## 2021-06-15 ENCOUNTER — Ambulatory Visit (HOSPITAL_COMMUNITY)
Admission: RE | Admit: 2021-06-15 | Discharge: 2021-06-15 | Disposition: A | Discharge status: Home / Self Care | Payer: Commercial Managed Care - PPO | Source: Ambulatory Visit | Attending: Nurse Practitioner | Admitting: Nurse Practitioner

## 2021-06-15 ENCOUNTER — Other Ambulatory Visit: Payer: Self-pay

## 2021-06-15 DIAGNOSIS — M6208 Separation of muscle (nontraumatic), other site: Secondary | ICD-10-CM | POA: Diagnosis present

## 2021-06-21 ENCOUNTER — Other Ambulatory Visit: Payer: Self-pay | Admitting: Nurse Practitioner

## 2021-06-21 DIAGNOSIS — R109 Unspecified abdominal pain: Secondary | ICD-10-CM

## 2021-06-23 ENCOUNTER — Encounter: Payer: Self-pay | Admitting: Internal Medicine

## 2021-08-16 ENCOUNTER — Ambulatory Visit: Payer: Commercial Managed Care - PPO | Admitting: Gastroenterology

## 2021-08-29 ENCOUNTER — Other Ambulatory Visit: Payer: Self-pay | Admitting: Nurse Practitioner

## 2021-08-29 DIAGNOSIS — K2100 Gastro-esophageal reflux disease with esophagitis, without bleeding: Secondary | ICD-10-CM

## 2021-09-27 ENCOUNTER — Telehealth: Payer: Self-pay | Admitting: Family

## 2021-09-27 NOTE — Telephone Encounter (Signed)
Pt requesting to have CPE with Evelina Dun before 10/06/21 for insurance purpose with her job. Wants to know if she can be worked in before deadline.  Please advise and call patient.

## 2021-09-27 NOTE — Telephone Encounter (Signed)
Appt made

## 2021-10-02 ENCOUNTER — Other Ambulatory Visit: Payer: Self-pay | Admitting: Nurse Practitioner

## 2021-10-02 DIAGNOSIS — M6208 Separation of muscle (nontraumatic), other site: Secondary | ICD-10-CM

## 2021-10-04 ENCOUNTER — Other Ambulatory Visit: Payer: Self-pay

## 2021-10-04 ENCOUNTER — Encounter: Payer: Self-pay | Admitting: Family

## 2021-10-04 ENCOUNTER — Ambulatory Visit (INDEPENDENT_AMBULATORY_CARE_PROVIDER_SITE_OTHER): Payer: Commercial Managed Care - PPO | Admitting: Family

## 2021-10-04 VITALS — BP 126/69 | HR 91 | Temp 98.5°F | Ht 63.5 in | Wt 305.6 lb

## 2021-10-04 DIAGNOSIS — Z23 Encounter for immunization: Secondary | ICD-10-CM

## 2021-10-04 DIAGNOSIS — K2100 Gastro-esophageal reflux disease with esophagitis, without bleeding: Secondary | ICD-10-CM

## 2021-10-04 DIAGNOSIS — G4733 Obstructive sleep apnea (adult) (pediatric): Secondary | ICD-10-CM | POA: Diagnosis not present

## 2021-10-04 DIAGNOSIS — J45909 Unspecified asthma, uncomplicated: Secondary | ICD-10-CM

## 2021-10-04 DIAGNOSIS — Z0000 Encounter for general adult medical examination without abnormal findings: Secondary | ICD-10-CM

## 2021-10-04 DIAGNOSIS — Z0001 Encounter for general adult medical examination with abnormal findings: Secondary | ICD-10-CM

## 2021-10-04 DIAGNOSIS — E559 Vitamin D deficiency, unspecified: Secondary | ICD-10-CM

## 2021-10-04 MED ORDER — ALBUTEROL SULFATE HFA 108 (90 BASE) MCG/ACT IN AERS: 2.0000 | INHALATION_SPRAY | Freq: Four times a day (QID) | RESPIRATORY_TRACT | 2 refills | Status: DC | PRN

## 2021-10-04 MED ORDER — FLUTICASONE-SALMETEROL 100-50 MCG/ACT IN AEPB: 1.0000 | INHALATION_SPRAY | Freq: Two times a day (BID) | RESPIRATORY_TRACT | 4 refills | Status: DC

## 2021-10-04 MED ORDER — VITAMIN D (ERGOCALCIFEROL) 1.25 MG (50000 UNIT) PO CAPS: 50000.0000 [IU] | ORAL_CAPSULE | ORAL | 3 refills | Status: DC

## 2021-10-04 NOTE — Patient Instructions (Signed)

## 2021-10-04 NOTE — Progress Notes (Signed)
Subjective:    Patient ID: Barbara Hinton, female    DOB: 1971/06/28, 50 y.o.   MRN: [id number]  Chief Complaint  Patient presents with   Annual Exam   Pt presents to the office today for CPE without pap. She is morbid obese with a  BMI of 53. Asthma There is no cough or wheezing. This is a chronic problem. The current episode started more than 1 year ago. Associated symptoms include heartburn. Her past medical history is significant for asthma.  Gastroesophageal Reflux She complains of belching and heartburn. She reports no coughing or no wheezing. This is a chronic problem. The current episode started more than 1 year ago. The problem occurs occasionally. Risk factors include obesity. She has tried a PPI for the symptoms. The treatment provided moderate relief.  OSA  Uses CPAP nightly. Stable.    Review of Systems  Respiratory:  Negative for cough and wheezing.   Gastrointestinal:  Positive for heartburn.  All other systems reviewed and are negative.  Family History  Problem Relation Age of Onset   Diabetes Mother    Diabetes Father    Hypertension Father    Hypertension Brother    Heart attack Maternal Grandfather    Heart attack Paternal Grandfather    Social History   Socioeconomic History   Marital status: Married    Spouse name: Not on file   Number of children: Not on file   Years of education: Not on file   Highest education level: Not on file  Occupational History   Not on file  Tobacco Use   Smoking status: Never    Passive exposure: Yes   Smokeless tobacco: Never  Substance and Sexual Activity   Alcohol use: No   Drug use: No   Sexual activity: Not on file  Other Topics Concern   Not on file  Social History Narrative   Not on file   Social Determinants of Health   Financial Resource Strain: Not on file  Food Insecurity: Not on file  Transportation Needs: Not on file  Physical Activity: Not on file  Stress: Not on file  Social Connections:  Not on file       Objective:   Physical Exam Vitals reviewed.  Constitutional:      General: She is not in acute distress.    Appearance: She is well-developed. She is obese.  HENT:     Head: Normocephalic and atraumatic.     Right Ear: Tympanic membrane normal.     Left Ear: Tympanic membrane normal.  Eyes:     Pupils: Pupils are equal, round, and reactive to light.  Neck:     Thyroid: No thyromegaly.  Cardiovascular:     Rate and Rhythm: Normal rate and regular rhythm.     Heart sounds: Normal heart sounds. No murmur heard. Pulmonary:     Effort: Pulmonary effort is normal. No respiratory distress.     Breath sounds: Normal breath sounds. No wheezing.  Abdominal:     General: Bowel sounds are normal. There is no distension.     Palpations: Abdomen is soft.     Tenderness: There is no abdominal tenderness.  Musculoskeletal:        General: No tenderness. Normal range of motion.     Cervical back: Normal range of motion and neck supple.  Skin:    General: Skin is warm and dry.  Neurological:     Mental Status: She is alert and oriented to  person, place, and time.     Cranial Nerves: No cranial nerve deficit.     Deep Tendon Reflexes: Reflexes are normal and symmetric.  Psychiatric:        Behavior: Behavior normal.        Thought Content: Thought content normal.        Judgment: Judgment normal.      BP 126/69   Pulse 91   Temp 98.5 F (36.9 C) (Temporal)   Ht 5' 3.5" (1.613 m)   Wt (!) 305 lb 9.6 oz (138.6 kg)   BMI 53.29 kg/m      Assessment & Plan:  Barbara Hinton comes in today with chief complaint of Annual Exam   Diagnosis and orders addressed:  1. Mild asthma without complication, unspecified whether persistent - CMP14+EGFR - CBC with Differential/Platelet - albuterol (VENTOLIN HFA) 108 (90 Base) MCG/ACT inhaler; Inhale 2 puffs into the lungs every 6 (six) hours as needed for wheezing or shortness of breath.  Dispense: 18 g; Refill: 2 -  fluticasone-salmeterol (ADVAIR) 100-50 MCG/ACT AEPB; Inhale 1 puff into the lungs 2 (two) times daily.  Dispense: 3 each; Refill: 4  2. OSA (obstructive sleep apnea) - CMP14+EGFR - CBC with Differential/Platelet  3. Gastroesophageal reflux disease with esophagitis without hemorrhage - CMP14+EGFR - CBC with Differential/Platelet  4. Obesity, morbid ([place]) - CMP14+EGFR - CBC with Differential/Platelet  5. Vitamin D deficiency - CMP14+EGFR - CBC with Differential/Platelet - VITAMIN D 25 Hydroxy (Vit-D Deficiency, Fractures) - Vitamin D, Ergocalciferol, (DRISDOL) 1.25 MG (50000 UNIT) CAPS capsule; Take 1 capsule (50,000 Units total) by mouth every 7 (seven) days.  Dispense: 12 capsule; Refill: 3  6. Annual physical exam - CMP14+EGFR - CBC with Differential/Platelet - Lipid panel - TSH - VITAMIN D 25 Hydroxy (Vit-D Deficiency, Fractures)   Labs pending Health Maintenance reviewed Diet and exercise encouraged  Follow up plan: 1 year    Evelina Dun, FNP

## 2021-10-05 LAB — CMP14+EGFR
ALT: 34 IU/L — ABNORMAL HIGH (ref 0–32)
AST: 45 IU/L — ABNORMAL HIGH (ref 0–40)
Albumin/Globulin Ratio: 1.6 (ref 1.2–2.2)
Albumin: 3.9 g/dL (ref 3.8–4.8)
Alkaline Phosphatase: 89 IU/L (ref 44–121)
BUN/Creatinine Ratio: 11 (ref 9–23)
BUN: 10 mg/dL (ref 6–24)
Bilirubin Total: 0.5 mg/dL (ref 0.0–1.2)
CO2: 24 mmol/L (ref 20–29)
Calcium: 9.3 mg/dL (ref 8.7–10.2)
Chloride: 107 mmol/L — ABNORMAL HIGH (ref 96–106)
Creatinine, Ser: 0.87 mg/dL (ref 0.57–1.00)
Globulin, Total: 2.5 g/dL (ref 1.5–4.5)
Glucose: 114 mg/dL — ABNORMAL HIGH (ref 70–99)
Potassium: 4.9 mmol/L (ref 3.5–5.2)
Sodium: 143 mmol/L (ref 134–144)
Total Protein: 6.4 g/dL (ref 6.0–8.5)
eGFR: 82 mL/min/{1.73_m2} (ref >59)

## 2021-10-05 LAB — LIPID PANEL
Chol/HDL Ratio: 3.7 ratio (ref 0.0–4.4)
Cholesterol, Total: 149 mg/dL (ref 100–199)
HDL: 40 mg/dL (ref >39)
LDL Chol Calc (NIH): 84 mg/dL (ref 0–99)
Triglycerides: 142 mg/dL (ref 0–149)
VLDL Cholesterol Cal: 25 mg/dL (ref 5–40)

## 2021-10-05 LAB — CBC WITH DIFFERENTIAL/PLATELET
Basophils Absolute: 0.1 10*3/uL (ref 0.0–0.2)
Basos: 1 %
EOS (ABSOLUTE): 0.1 10*3/uL (ref 0.0–0.4)
Eos: 2 %
Hematocrit: 42.1 % (ref 34.0–46.6)
Hemoglobin: 13.7 g/dL (ref 11.1–15.9)
Immature Grans (Abs): 0 10*3/uL (ref 0.0–0.1)
Immature Granulocytes: 0 %
Lymphocytes Absolute: 1.9 10*3/uL (ref 0.7–3.1)
Lymphs: 33 %
MCH: 28 pg (ref 26.6–33.0)
MCHC: 32.5 g/dL (ref 31.5–35.7)
MCV: 86 fL (ref 79–97)
Monocytes Absolute: 0.4 10*3/uL (ref 0.1–0.9)
Monocytes: 7 %
Neutrophils Absolute: 3.3 10*3/uL (ref 1.4–7.0)
Neutrophils: 57 %
Platelets: 302 10*3/uL (ref 150–450)
RBC: 4.9 x10E6/uL (ref 3.77–5.28)
RDW: 13.2 % (ref 11.7–15.4)
WBC: 5.7 10*3/uL (ref 3.4–10.8)

## 2021-10-05 LAB — TSH: TSH: 2.12 u[IU]/mL (ref 0.450–4.500)

## 2021-10-05 LAB — VITAMIN D 25 HYDROXY (VIT D DEFICIENCY, FRACTURES): Vit D, 25-Hydroxy: 24.5 ng/mL — ABNORMAL LOW (ref 30.0–100.0)

## 2021-10-06 ENCOUNTER — Other Ambulatory Visit: Payer: Self-pay | Admitting: Family

## 2021-11-03 ENCOUNTER — Other Ambulatory Visit: Payer: Self-pay | Admitting: Family

## 2021-11-03 DIAGNOSIS — Z139 Encounter for screening, unspecified: Secondary | ICD-10-CM

## 2021-11-05 ENCOUNTER — Telehealth: Payer: Commercial Managed Care - PPO | Admitting: Physician Assistant

## 2021-11-05 DIAGNOSIS — J4531 Mild persistent asthma with (acute) exacerbation: Secondary | ICD-10-CM | POA: Diagnosis not present

## 2021-11-05 MED ORDER — ALBUTEROL SULFATE (2.5 MG/3ML) 0.083% IN NEBU: 2.5000 mg | INHALATION_SOLUTION | Freq: Four times a day (QID) | RESPIRATORY_TRACT | 1 refills | Status: DC | PRN

## 2021-11-05 MED ORDER — PREDNISONE 20 MG PO TABS: 40.0000 mg | ORAL_TABLET | Freq: Every day | ORAL | 0 refills | Status: DC

## 2021-11-05 NOTE — Progress Notes (Signed)
Visit for Asthma  Based on what you have shared with me, it looks like you may have a flare up of your asthma secondary to a mild viral infection. Asthma is a chronic (ongoing) lung disease which results in airway obstruction, inflammation and hyper-responsiveness.   Asthma symptoms vary from person to person, with common symptoms including nighttime awakening and decreased ability to participate in normal activities as a result of shortness of breath. It is often triggered by changes in weather, changes in the season, changes in air temperature, or inside (home, school, daycare or work) allergens such as animal dander, mold, mildew, woodstoves or cockroaches.   It can also be triggered by hormonal changes, extreme emotion, physical exertion or an upper respiratory tract illness.     It is important to identify the trigger, and then eliminate or avoid the trigger if possible.   If you have been prescribed medications to be taken on a regular basis, it is important to follow the asthma action plan and to follow guidelines to adjust medication in response to increasing symptoms of decreased peak expiratory flow rate  Treatment: I have prescribed: Prednisone 40mg  by mouth per day for 5 - 7 days. What inhalers are you out of? Just the Albuterol and nebulizer solution or have you been out of your Advair as well? Please let me know so we can make sure scripts are sent to the pharmacy.   HOME CARE Only take medications as instructed by your medical team. Consider wearing a mask or scarf to improve breathing air temperature have been shown to decrease irritation and decrease exacerbations Get rest. Taking a steamy shower or using a humidifier may help nasal congestion sand ease sore throat pain. You can place a towel over your head and breathe in the steam from hot water coming from a  faucet. Using a saline nasal spray works much the same way.  Cough drops, hare candies and sore throat lozenges may ease your cough.  Avoid close contacts especially the very you and the elderly Cover your mouth if you cough or sneeze Always remember to wash your hands.    GET HELP RIGHT AWAY IF: You develop worsening symptoms; breathlessness at rest, drowsy, confused or agitated, unable to speak in full sentences You have coughing fits You develop a severe headache or visual changes You develop shortness of breath, difficulty breathing or start having chest pain Your symptoms persist after you have completed your treatment plan If your symptoms do not improve within 10 days  MAKE SURE YOU Understand these instructions. Will watch your condition. Will get help right away if you are not doing well or get worse.   Your e-visit answers were reviewed by a board certified advanced clinical practitioner to complete your personal care plan, Depending upon the condition, your plan could have included both over the counter or prescription medications.   Please review your pharmacy choice. Your safety is important to [place]. If you have drug allergies check your prescription carefully.  You can use MyChart to ask questions about today's visit, request a non-urgent  call back, or ask for a work or school excuse for 24 hours related to this e-Visit. If it has been greater than 24 hours you will need to follow up with your provider, or enter a new e-Visit to address those concerns.   You will get an e-mail in the next two days asking about your experience. I hope that your e-visit has been valuable and will speed  your recovery. Thank you for using e-visits.

## 2021-11-08 ENCOUNTER — Other Ambulatory Visit: Payer: Self-pay | Admitting: Family

## 2021-11-16 ENCOUNTER — Ambulatory Visit
Admission: RE | Admit: 2021-11-16 | Discharge: 2021-11-16 | Disposition: A | Discharge status: Home / Self Care | Payer: Commercial Managed Care - PPO | Source: Ambulatory Visit | Attending: Family | Admitting: Family

## 2021-11-16 DIAGNOSIS — Z139 Encounter for screening, unspecified: Secondary | ICD-10-CM

## 2021-12-05 ENCOUNTER — Other Ambulatory Visit: Payer: Self-pay | Admitting: Nurse Practitioner

## 2021-12-05 DIAGNOSIS — K2100 Gastro-esophageal reflux disease with esophagitis, without bleeding: Secondary | ICD-10-CM

## 2021-12-28 ENCOUNTER — Encounter: Payer: Self-pay | Admitting: Gastroenterology

## 2021-12-28 ENCOUNTER — Other Ambulatory Visit: Payer: Self-pay

## 2021-12-28 ENCOUNTER — Ambulatory Visit (INDEPENDENT_AMBULATORY_CARE_PROVIDER_SITE_OTHER): Payer: Commercial Managed Care - PPO | Admitting: Gastroenterology

## 2021-12-28 VITALS — BP 123/77 | HR 81 | Temp 97.5°F | Ht 63.0 in | Wt 311.4 lb

## 2021-12-28 DIAGNOSIS — K219 Gastro-esophageal reflux disease without esophagitis: Secondary | ICD-10-CM | POA: Diagnosis not present

## 2021-12-28 DIAGNOSIS — Z1211 Encounter for screening for malignant neoplasm of colon: Secondary | ICD-10-CM | POA: Diagnosis not present

## 2021-12-28 DIAGNOSIS — Z1212 Encounter for screening for malignant neoplasm of rectum: Secondary | ICD-10-CM | POA: Diagnosis not present

## 2021-12-28 MED ORDER — PANTOPRAZOLE SODIUM 40 MG PO TBEC: 40.0000 mg | DELAYED_RELEASE_TABLET | Freq: Every day | ORAL | 3 refills | Status: DC

## 2021-12-28 NOTE — Patient Instructions (Addendum)
Please start pantoprazole (Protonix) once each morning, 30 minutes before breakfast. Let me know if any problems swallowing, pain after eating, persistent nausea, or vomiting.   You likely have what's called "diastasis recti". I recommend a brace at work when lifting heavy. If you have a knot that protrudes and does not go away, please call us. If severe abdominal pain, go to the emergency room.   We have sent Cologuard request to the company, and they will mail to you. Please let us know if you haven't received in a few weeks.  I have included a fatty liver handout as well.  We will see you in 3 months!  It was a pleasure to see you today. I want to create trusting relationships with patients to provide genuine, compassionate, and quality care. I value your feedback. If you receive a survey regarding your visit,  I greatly appreciate you taking time to fill this out.   Annitta Needs, PhD, ANP-BC [facility]

## 2021-12-28 NOTE — Progress Notes (Signed)
Primary Care Physician:  Sharion Balloon, FNP Referring Physician: Jac Canavan, NP Primary Gastroenterologist:  Dr. Abbey Chatters  Chief Complaint  Patient presents with   Abdominal Pain    Across upper abd and middle. Not painful now. Seen bulge in the area. Sometimes has sharp pain in abd when bowels move   Nausea    Occ, no vomiting   Gas    HPI:   Barbara Hinton is a 51 y.o. female presenting today at the request of Jac Canavan, NP, due to abdominal pain and nausea. She originally had an appointment in October but had to cancel, as her brother underwent a liver transplantation. Barbara Hinton Aug 2022 with hepatic steatosis, otherwise unremarkable. Labs Nov 2022 with normal CBC, AST mildly elevated at 45 and ALT 34.   She notes that in June/July 2022, she started having upper abdominal pain and down midline. Triggered with picking things up. No pain after eating during this episode. Burps a lot and has a lot of gas. When sitting up from laying down feels a bulging in upper abdomen.   Once in a blue moon will have nausea. Has GERD. Takes Tums. No dysphagia. Sometimes early satiety. Will take Ibuprofen for back pain. No PPI.   Will at times Feels sharp pain with food traveling through stomach. Period every 3 months then frequent back to back. Perimenopausal.   BM usually every morning. Sometimes takes awhile to get started. No melena or hematochezia. No weight loss or lack of appetite. Gaining weight.   Brother with history of cirrhosis due to ETOH, fatty liver.    Past Medical History:  Diagnosis Date   Asthma    OSA (obstructive sleep apnea) 12/04/2017   Mild with AHI overall 9.8/hr but during REM sleep severe with AHI 53.8/hr and nocturnal hypoxemia now on CPAP at 10cm H2O.      Past Surgical History:  Procedure Laterality Date   CESAREAN SECTION      Current Outpatient Medications  Medication Sig Dispense Refill   albuterol (PROVENTIL) (2.5 MG/3ML) 0.083% nebulizer  solution Take 3 mLs (2.5 mg total) by nebulization every 6 (six) hours as needed for wheezing or shortness of breath. 150 mL 1   aspirin 81 MG tablet Take 81 mg by mouth 2 (two) times a week.     fluticasone-salmeterol (ADVAIR) 100-50 MCG/ACT AEPB Inhale 1 puff into the lungs 2 (two) times daily. (Patient taking differently: Inhale 1 puff into the lungs daily.) 3 each 4   pantoprazole (PROTONIX) 40 MG tablet Take 1 tablet (40 mg total) by mouth daily. 30 minutes before breakfast 90 tablet 3   Vitamin D, Ergocalciferol, (DRISDOL) 1.25 MG (50000 UNIT) CAPS capsule Take 1 capsule (50,000 Units total) by mouth every 7 (seven) days. 12 capsule 3   No current facility-administered medications for this visit.    Allergies as of 12/28/2021   (No Known Allergies)    Family History  Problem Relation Age of Onset   Diabetes Mother    Diabetes Father    Hypertension Father    Cirrhosis Brother        liver transplant   Hypertension Brother    Heart attack Maternal Grandfather    Heart attack Paternal Grandfather    Colon cancer Neg Hx    Colon polyps Neg Hx     Social History   Socioeconomic History   Marital status: Married    Spouse name: Not on file   Number of  children: Not on file   Years of education: Not on file   Highest education level: Not on file  Occupational History   Not on file  Tobacco Use   Smoking status: Never    Passive exposure: Yes   Smokeless tobacco: Never  Substance and Sexual Activity   Alcohol use: No   Drug use: No   Sexual activity: Not on file  Other Topics Concern   Not on file  Social History Narrative   Not on file   Social Determinants of Health   Financial Resource Strain: Not on file  Food Insecurity: Not on file  Transportation Needs: Not on file  Physical Activity: Not on file  Stress: Not on file  Social Connections: Not on file  Intimate Partner Violence: Not on file    Review of Systems: Gen: Denies any fever, chills, fatigue,  weight loss, lack of appetite.  CV: +palpitations, no DOE Resp: Denies shortness of breath at rest or with exertion. Denies wheezing or cough.  GI: see HPI GU : Denies urinary burning, urinary frequency, urinary hesitancy MS: Denies joint pain, muscle weakness, cramps, or limitation of movement.  Derm: Denies rash, itching, dry skin Psych: Denies depression, anxiety, memory loss, and confusion Heme: Denies bruising, bleeding, and enlarged lymph nodes.  Physical Exam: BP 123/77    Pulse 81    Temp (!) 97.5 F (36.4 C) (Temporal)    Ht 5\' 3"  (1.6 m)    Wt (!) 311 lb 6.4 oz (141.3 kg)    LMP 12/20/2021 (Approximate)    BMI 55.16 kg/m  General:   Alert and oriented. Pleasant and cooperative. Well-nourished and well-developed.  Head:  Normocephalic and atraumatic. Eyes:  Without icterus, sclera clear and conjunctiva pink.  Ears:  Normal auditory acuity. Mouth:  mask in place Lungs:  Clear to auscultation bilaterally.  Heart:  S1, S2 present without murmurs appreciated.  Abdomen:  +BS, obese, soft, non-tender and non-distended. Bulge in midline consistent with diastasis recti.  Rectal:  Deferred  Msk:  Symmetrical without gross deformities. Normal posture. Extremities:  Without edema. Neurologic:  Alert and  oriented x4;  grossly normal neurologically. Skin:  Intact without significant lesions or rashes. Psych:  Alert and cooperative. Normal mood and affect.  ASSESSMENT: Barbara Hinton is a 51 y.o. female presenting today at the request of Jac Canavan, NP, due to abdominal pain and nausea. Barbara Hinton on file with fatty liver.   Abdominal pain: initial onset in June/July 2022 but now improved significantly. Triggered by movement and exam most consistent with diastasis recti. She has no postprandial abdominal pain, very rare nausea, no dysphagia. She does report GERD symptoms that are uncontrolled and currently not on any PPI. No red flags. Will start pantoprazole once daily.   Constipation:  mild. Will start Benefiber 2 teaspoons daily.   Hepatic steatosis: mildly elevated AST at 45 and ALT 34. Discussed fatty liver diet/behavior modifications. Will repeat HFP at next visit. Brother with history of cirrhosis due to fatty liver/ETOH use and underwent transplantation. Patient denies any ETOH use.   PLAN:  Cologuard Start Protonix daily Benefiber daily Repeat HFP at next visit Fatty liver diet/behavior modification, weight loss discussed Return in 3 months   Annitta Needs, PhD, [facility]

## 2022-01-19 ENCOUNTER — Telehealth: Payer: Self-pay | Admitting: Gastroenterology

## 2022-01-19 NOTE — Telephone Encounter (Signed)
Spoke with pt and she confirmed that she did receive it. Pt has not submitted it back to [employer] yet. Pt is wanting to find out from her insurance if it is going to be covered, because pt found out that for some reason we are considered out of network with her insurance. I informed pt that most insurances cover Cologuard because it is a screening, but advised her to check with her insurance to be sure.  ?

## 2022-01-19 NOTE — Telephone Encounter (Signed)
Patient had cologuard ordered at visit. Can we find out if she ever received anything? If so, it hasn't resulted. Thanks! ?

## 2022-02-14 NOTE — Progress Notes (Signed)
? ?Date:  02/15/2022  ? ?ID:  LYNNAE Barbara Hinton, DOB [birth year], MRN [id number] ? ?PCP:  Sharion Balloon, FNP  ?Sleep Medicine:  Fransico Him, MD ?Electrophysiologist:  None  ? ?Chief Complaint:  OSA ? ?History of Present Illness:   ? ?Barbara Hinton is a 51 y.o. female with a hx of obesity, snoring and excessive daytime sleepiness with an ESS of 16 who was referred by Dr. Tamala Julian for sleep study.  Her PSG showed mild OSA overall with an AHI of 9.8/hr but severe during REM sleep with an AHI of 53.8/hr and nocturnal hypoxemia with O2 desats as low as 81%.  She underwent CPAP titration to 10cm H2O.  She also has a hx of trivial MR by echo 2021.   ? ?She is doing well with her CPAP device and thinks that she has gotten used to it.  She tolerates the mask and feels the pressure is adequate.  Since going on CPAP she feels rested in the am and has no significant daytime sleepiness.  She denies any significant mouth or nasal dryness or nasal congestion.  She does not think that he snores.   She still has occasional palpitations but not bothersome.  They are very sporadic and feel like a hard skipped beat.  She has a hx of PVCs in the past by heart monitor.  ? ?Prior CV studies:   ?The following studies were reviewed today: ? ?PAP compliance download ? ?Past Medical History:  ?Diagnosis Date  ? Asthma   ? OSA (obstructive sleep apnea) 12/04/2017  ? Mild with AHI overall 9.8/hr but during REM sleep severe with AHI 53.8/hr and nocturnal hypoxemia now on CPAP at 10cm H2O.    ? ?Past Surgical History:  ?Procedure Laterality Date  ? CESAREAN SECTION    ?  ? ?Current Meds  ?Medication Sig  ? albuterol (PROVENTIL) (2.5 MG/3ML) 0.083% nebulizer solution Take 3 mLs (2.5 mg total) by nebulization every 6 (six) hours as needed for wheezing or shortness of breath.  ? aspirin 81 MG tablet Take 81 mg by mouth 2 (two) times a week.  ? fluticasone-salmeterol (ADVAIR) 100-50 MCG/ACT AEPB Inhale 1 puff into the lungs 2 (two) times daily.  (Patient taking differently: Inhale 1 puff into the lungs daily.)  ? pantoprazole (PROTONIX) 40 MG tablet Take 1 tablet (40 mg total) by mouth daily. 30 minutes before breakfast  ? Vitamin D, Ergocalciferol, (DRISDOL) 1.25 MG (50000 UNIT) CAPS capsule Take 1 capsule (50,000 Units total) by mouth every 7 (seven) days.  ?  ? ?Allergies:   Patient has no known allergies.  ? ?Social History  ? ?Tobacco Use  ? Smoking status: Never  ?  Passive exposure: Yes  ? Smokeless tobacco: Never  ?Substance Use Topics  ? Alcohol use: No  ? Drug use: No  ?  ? ?Family Hx: ?The patient's family history includes Cirrhosis in her brother; Diabetes in her father and mother; Heart attack in her maternal grandfather and paternal grandfather; Hypertension in her brother and father. There is no history of Colon cancer or Colon polyps. ? ?ROS:   ?Please see the history of present illness.    ? ?All other systems reviewed and are negative. ? ? ?Labs/Other Tests and Data Reviewed:   ? ?Recent Labs: ?10/04/2021: ALT 34; BUN 10; Creatinine, Ser 0.87; Hemoglobin 13.7; Platelets 302; Potassium 4.9; Sodium 143; TSH 2.120  ? ?Recent Lipid Panel ?Lab Results  ?Component Value Date/Time  ? CHOL 149 10/04/2021 09:21  AM  ? TRIG 142 10/04/2021 09:21 AM  ? HDL 40 10/04/2021 09:21 AM  ? CHOLHDL 3.7 10/04/2021 09:21 AM  ? LDLCALC 84 10/04/2021 09:21 AM  ? ? ?Wt Readings from Last 3 Encounters:  ?02/15/22 (!) 313 lb (142 kg)  ?12/28/21 (!) 311 lb 6.4 oz (141.3 kg)  ?10/04/21 (!) 305 lb 9.6 oz (138.6 kg)  ?  ? ?Objective:   ? ?Vital Signs:  BP 134/86   Pulse 78   Ht '5\' 3"'$  (1.6 m)   Wt (!) 313 lb (142 kg)   SpO2 96%   BMI 55.45 kg/m?   ? ?GEN: Well nourished, well developed in no acute distress ?HEENT: Normal ?NECK: No JVD; No carotid bruits ?LYMPHATICS: No lymphadenopathy ?CARDIAC:RRR, no murmurs, rubs, gallops ?RESPIRATORY:  Clear to auscultation without rales, wheezing or rhonchi  ?ABDOMEN: Soft, non-tender, non-distended ?MUSCULOSKELETAL:  No edema; No  deformity  ?SKIN: Warm and dry ?NEUROLOGIC:  Alert and oriented x 3 ?PSYCHIATRIC:  Normal affect   ? ?EKG was performed in the office today and demonstrates NSR with low voltage QRS and no ST changes ? ?ASSESSMENT & PLAN:   ? ?1.  OSA - The patient is tolerating PAP therapy well without any problems. The PAP download performed by his DME was personally reviewed and interpreted by me today and showed an AHI of 0.6 /hr on 10 cm H2O with 100% compliance in using more than 4 hours nightly.  The patient has been using and benefiting from PAP use and will continue to benefit from therapy.  ?-I will order new PAP supplies with new mask ? ?2.  PVCs ?-these are fairly asymptomatic ?  ? ?Medication Adjustments/Labs and Tests Ordered: ?Current medicines are reviewed at length with the patient today.  Concerns regarding medicines are outlined above.  ?Tests Ordered: ?Orders Placed This Encounter  ?Procedures  ? EKG 12-Lead  ? ?Medication Changes: ?No orders of the defined types were placed in this encounter. ? ? ?Disposition:  Follow up in 1 year(s) ? ?Signed, ?Fransico Him, MD  ?02/15/2022 8:28 AM    ?[place] ?

## 2022-02-15 ENCOUNTER — Encounter: Payer: Self-pay | Admitting: Cardiology

## 2022-02-15 ENCOUNTER — Ambulatory Visit (INDEPENDENT_AMBULATORY_CARE_PROVIDER_SITE_OTHER): Payer: Commercial Managed Care - PPO | Admitting: Cardiology

## 2022-02-15 VITALS — BP 134/86 | HR 78 | Ht 63.0 in | Wt 313.0 lb

## 2022-02-15 DIAGNOSIS — I493 Ventricular premature depolarization: Secondary | ICD-10-CM | POA: Diagnosis not present

## 2022-02-15 DIAGNOSIS — G4733 Obstructive sleep apnea (adult) (pediatric): Secondary | ICD-10-CM

## 2022-02-15 NOTE — Patient Instructions (Signed)
Medication Instructions:  ?Your physician recommends that you continue on your current medications as directed. Please refer to the Current Medication list given to you today. ? ?*If you need a refill on your cardiac medications before your next appointment, please call your pharmacy* ? ?Follow-Up: ?At [facility], you and your health needs are our priority.  As part of our continuing mission to provide you with exceptional heart care, we have created designated Provider Care Teams.  These Care Teams include your primary Cardiologist (physician) and Advanced Practice Providers (APPs -  Physician Assistants and Nurse Practitioners) who all work together to provide you with the care you need, when you need it. ? ?Your next appointment:   ?1 year ? ?The format for your next appointment:   ?In Person ? ?Provider: ?Fransico Him, MD ? ?If primary card or EP is not listed click here to update    :1}  ? ? ? ?Important Information About Sugar ? ? ? ? ?  ?

## 2022-03-20 ENCOUNTER — Other Ambulatory Visit: Payer: Self-pay | Admitting: Family

## 2022-03-20 DIAGNOSIS — J45909 Unspecified asthma, uncomplicated: Secondary | ICD-10-CM

## 2022-04-10 NOTE — Progress Notes (Unsigned)
GI Office Note    Referring Provider: Sharion Balloon, FNP Primary Care Physician:  Sharion Balloon, FNP Primary GI: Dr. Abbey Chatters  Date:  04/11/2022  ID:  Barbara Hinton, DOB 1971/06/06, MRN [id number]   Chief Complaint   Chief Complaint  Patient presents with   Follow-up     History of Present Illness  Barbara Hinton is a 51 y.o. female  with a history of asthma, OSA, constipation presenting today for follow up of fatty liver, constipation, and abdominal pain.  Originally referred due to abdominal pain and nausea.  History of her ultrasound August 2022 with hepatic steatosis, otherwise unremarkable.  Labs done November 2022 with normal CBC, lipid panel, TSH, slightly elevated glucose, AST 45, ALT 34.  Last seen in clinic 12/28/21.  Reported occasional nausea and GERD.  Taking Tums.  Denied dysphagia, sometimes had early satiety, was using ibuprofen for back pain not on PPI.  At times she felt like she would have a sharp pain with food traveling through her stomach.  Having a BM every morning that sometimes takes a lot to get started.  Denied melena or BRBPR.  Reported gaining weight not losing weight.  Brother with history of cirrhosis and fatty liver.  She was started on PPI 30 minutes before breakfast.  Also sent in Cologuard for out of completed.  Fatty liver handout provided.  Also advised to begin Benefiber daily.   Today: Was Cologuard completed - has not sent it in but opened it up and looked at the stuff. The problem has been finding a UPS place to get it picked up.   GERD - Protonix daily, still having nausea about once or twice a week. Will have frequent belching even with not eating. Notices that with Dr. Malachi Bonds she will get sick. Has been drinking water and tea in the morning. Has cut out a lot of spicy foods that has improved her reflux as well. Has been working on her diet, trying new drinks. Reports she does feel like she has air stuck in her chest at times (thought she  may was having a heart attack). Felt like she couldn't burp at that time.   Constipation - Doing okay. Having at least one every morning, sometimes even twice a day. Has cut back on the fiber. Not having to strain as much. Does somewhat have some incomplete emptying.   Abdominal pain - has been better, once in a blue moon with have LUQ pain. Has stopped lifting heavier things at work.    Past Medical History:  Diagnosis Date   Asthma    OSA (obstructive sleep apnea) 12/04/2017   Mild with AHI overall 9.8/hr but during REM sleep severe with AHI 53.8/hr and nocturnal hypoxemia now on CPAP at 10cm H2O.      Past Surgical History:  Procedure Laterality Date   CESAREAN SECTION      Current Outpatient Medications  Medication Sig Dispense Refill   albuterol (VENTOLIN HFA) 108 (90 Base) MCG/ACT inhaler TAKE 2 PUFFS BY MOUTH EVERY 6 HOURS AS NEEDED FOR WHEEZE OR SHORTNESS OF BREATH 8.5 each 2   aspirin 81 MG tablet Take 81 mg by mouth 2 (two) times a week.     fluticasone-salmeterol (ADVAIR) 100-50 MCG/ACT AEPB Inhale 1 puff into the lungs 2 (two) times daily. (Patient taking differently: Inhale 1 puff into the lungs daily.) 3 each 4   pantoprazole (PROTONIX) 40 MG tablet Take 1 tablet (40 mg total) by mouth daily.  30 minutes before breakfast 90 tablet 3   Vitamin D, Ergocalciferol, (DRISDOL) 1.25 MG (50000 UNIT) CAPS capsule Take 1 capsule (50,000 Units total) by mouth every 7 (seven) days. 12 capsule 3   albuterol (PROVENTIL) (2.5 MG/3ML) 0.083% nebulizer solution Take 3 mLs (2.5 mg total) by nebulization every 6 (six) hours as needed for wheezing or shortness of breath. (Patient not taking: Reported on 04/11/2022) 150 mL 1   No current facility-administered medications for this visit.    Allergies as of 04/11/2022   (No Known Allergies)    Family History  Problem Relation Age of Onset   Diabetes Mother    Diabetes Father    Hypertension Father    Cirrhosis Brother        liver  transplant   Hypertension Brother    Heart attack Maternal Grandfather    Heart attack Paternal Grandfather    Colon cancer Neg Hx    Colon polyps Neg Hx     Social History   Socioeconomic History   Marital status: Married    Spouse name: Not on file   Number of children: Not on file   Years of education: Not on file   Highest education level: Not on file  Occupational History   Not on file  Tobacco Use   Smoking status: Never    Passive exposure: Yes   Smokeless tobacco: Never  Substance and Sexual Activity   Alcohol use: No   Drug use: No   Sexual activity: Not on file  Other Topics Concern   Not on file  Social History Narrative   Not on file   Social Determinants of Health   Financial Resource Strain: Not on file  Food Insecurity: Not on file  Transportation Needs: Not on file  Physical Activity: Not on file  Stress: Not on file  Social Connections: Not on file     Review of Systems   Gen: Denies fever, chills, anorexia. Denies fatigue, weakness, weight loss.  CV: Denies chest pain, palpitations, syncope, peripheral edema, and claudication. Resp: Denies dyspnea at rest, cough, wheezing, coughing up blood, and pleurisy. GI: see HPI. Derm: Denies rash, itching, dry skin Psych: Denies depression, anxiety, memory loss, confusion. No homicidal or suicidal ideation.  Heme: Denies bruising, bleeding, and enlarged lymph nodes.   Physical Exam   BP 130/66   Pulse 88   Temp 97.6 F (36.4 C) (Temporal)   Ht '5\' 7"'$  (1.702 m)   Wt (!) 315 lb 6.4 oz (143.1 kg)   LMP 03/28/2022 (Approximate)   BMI 49.40 kg/m   General:   Alert and oriented. No distress noted. Pleasant and cooperative.  Head:  Normocephalic and atraumatic. Eyes:  Conjuctiva clear without scleral icterus. Lungs:  Clear to auscultation bilaterally. No wheezes, rales, or rhonchi. No distress.  Heart:  S1, S2 present without murmurs appreciated.  Abdomen:  +BS, soft, obese, mild tenderness to  periumbilical and epigastric region. Diastasis recti present. Rectal: deferred Msk:  Symmetrical without gross deformities. Normal posture. Extremities:  Without edema. Neurologic:  Alert and  oriented x4 Psych:  Alert and cooperative. Normal mood and affect.   Assessment  Barbara Hinton is a 51 y.o. female with a history of asthma, OSA, elevated liver enzymes, GERD, and constipation  presenting today for follow up.   Hepatic steatosis - Previously had mildly elevated AST 45 and ALT 34.  Denies any abdominal pain.  Brother has a history of cirrhosis due to fatty liver and alcohol use  and has had a transplant.  She denies any alcohol use.  Has been working on her diet and cutting out many things although she states that she is still gaining a little bit of weight. Weight 311 in Feb, currently 315. Discussed possibility of healthy weight and wellness for weight loss, will continue to discuss.  Discussed fatty liver diet and low-sodium diet. We will recheck HFP today.  Constipation -mild.  Was taking Benefiber 2 teaspoons daily but has cut back.  She is currently having a bowel movement every day, sometimes twice a day.  Possibly has some incomplete emptying.  Mended to continue Benefiber and if she begins having the lower abdominal discomfort she can begin taking 1 capful of MiraLAX as needed.  GERD - Much improved on Protonix 40 mg daily.  Does report that she sometimes forgets at breakfast and queried whether it was okay to take later on in the day.  Advised patient that this is okay as long as it is taken on empty stomach.  She does have some occasional nausea and frequent belching.  Also with complaints of feeling like air is trapped in her chest at times, has been cutting back on carbonated beverages and working on her diet including cutting out spicy foods.  Advise a symptoms worsen or do not improve in the next few months we can consider increasing PPI to twice daily.  May need to consider EGD  in the near future if this occurs.  Advised to complete Cologuard at earliest convenience. States an issue with [employer] not being near her house and difficulty getting pick up scheduled.  PLAN    HFP Continue pantoprazole 40 mg daily, 30 minutes prior to breakfast.  Recommend Gas-X (simethicone) as needed for frequent belching. Continue Benefiber 2 teaspoons daily Recommend 1 capful MiraLAX as needed if incomplete emptying or lower abdominal discomfort Advised to complete Cologuard at earliest convenience. Follow up in 6 months or sooner if needed.     Venetia Night, MSN, FNP-BC, AGACNP-BC [facility]

## 2022-04-11 ENCOUNTER — Encounter: Payer: Self-pay | Admitting: Gastroenterology

## 2022-04-11 ENCOUNTER — Ambulatory Visit (INDEPENDENT_AMBULATORY_CARE_PROVIDER_SITE_OTHER): Payer: Commercial Managed Care - PPO | Admitting: Gastroenterology

## 2022-04-11 VITALS — BP 130/66 | HR 88 | Temp 97.6°F | Ht 67.0 in | Wt 315.4 lb

## 2022-04-11 DIAGNOSIS — R7989 Other specified abnormal findings of blood chemistry: Secondary | ICD-10-CM | POA: Diagnosis not present

## 2022-04-11 DIAGNOSIS — K219 Gastro-esophageal reflux disease without esophagitis: Secondary | ICD-10-CM

## 2022-04-11 DIAGNOSIS — K760 Fatty (change of) liver, not elsewhere classified: Secondary | ICD-10-CM | POA: Diagnosis not present

## 2022-04-11 DIAGNOSIS — K5900 Constipation, unspecified: Secondary | ICD-10-CM

## 2022-04-11 NOTE — Patient Instructions (Addendum)
For your GERD: Continue taking your Protonix 40 mg daily, 30 minutes before breakfast or on empty stomach prior to lunch or dinner (if you forget to take before breakfast).  In regards to your frequent belching you may use Gas-X (active ingredient simethicone) as needed.  If your symptoms become more frequent over the next couple of months please call the office and we can consider increasing your Protonix to 40 mg twice daily.  Continue your GERD diet and cutting back on carbonated beverages as able.  For constipation: If you continue to feel like you are having incomplete emptying I would continue back on the fiber.  If you continue to have issues after starting back on fiber for couple weeks you may take 1 capful of MiraLAX as needed.  Complete your Cologuard at your convenience.  Please let us know if you have any issues with it.  I am providing you with lab slips to have your liver enzymes rechecked.  You may have this completed at your PCP office.  As [facility] results states they should send it to our office.  Will have you follow up in about 6 months or sooner if needed.   It was a pleasure to see you today. I want to create trusting relationships with patients. If you receive a survey regarding your visit,  I greatly appreciate you taking time to fill this out on paper or through your MyChart. I value your feedback.  Venetia Night, MSN, FNP-BC, AGACNP-BC [facility]

## 2022-04-12 ENCOUNTER — Other Ambulatory Visit: Payer: Commercial Managed Care - PPO

## 2022-04-13 LAB — HEPATIC FUNCTION PANEL
ALT: 29 IU/L (ref 0–32)
AST: 30 IU/L (ref 0–40)
Albumin: 3.9 g/dL (ref 3.8–4.8)
Alkaline Phosphatase: 88 IU/L (ref 44–121)
Bilirubin Total: 0.6 mg/dL (ref 0.0–1.2)
Bilirubin, Direct: 0.19 mg/dL (ref 0.00–0.40)
Total Protein: 6.7 g/dL (ref 6.0–8.5)

## 2022-09-12 ENCOUNTER — Ambulatory Visit (INDEPENDENT_AMBULATORY_CARE_PROVIDER_SITE_OTHER): Payer: Commercial Managed Care - PPO

## 2022-09-12 ENCOUNTER — Other Ambulatory Visit: Payer: Self-pay | Admitting: Family

## 2022-09-12 ENCOUNTER — Ambulatory Visit (INDEPENDENT_AMBULATORY_CARE_PROVIDER_SITE_OTHER): Payer: Commercial Managed Care - PPO | Admitting: Family

## 2022-09-12 ENCOUNTER — Encounter: Payer: Self-pay | Admitting: Family

## 2022-09-12 VITALS — BP 118/75 | HR 99 | Temp 97.8°F | Ht 67.0 in | Wt 312.0 lb

## 2022-09-12 DIAGNOSIS — M79672 Pain in left foot: Secondary | ICD-10-CM

## 2022-09-12 DIAGNOSIS — M25561 Pain in right knee: Secondary | ICD-10-CM

## 2022-09-12 DIAGNOSIS — E559 Vitamin D deficiency, unspecified: Secondary | ICD-10-CM

## 2022-09-12 DIAGNOSIS — M79671 Pain in right foot: Secondary | ICD-10-CM

## 2022-09-12 MED ORDER — PREDNISONE 10 MG (21) PO TBPK: ORAL_TABLET | ORAL | 0 refills | Status: DC

## 2022-09-12 MED ORDER — DICLOFENAC SODIUM 75 MG PO TBEC: 75.0000 mg | DELAYED_RELEASE_TABLET | Freq: Two times a day (BID) | ORAL | 0 refills | Status: DC

## 2022-09-12 NOTE — Progress Notes (Signed)
Subjective:    Patient ID: Barbara Hinton, female    DOB: 04-Apr-1971, 51 y.o.   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 She also having bilateral foot pain. She goes to a Restaurant manager, fast food weekly. At her job she is stands 10-12 hours a day on concrete floor..   Knee Pain  The incident occurred more than 1 week ago. There was no injury mechanism. The pain is present in the right knee. The quality of the pain is described as aching. The pain is at a severity of 10/10. The pain is moderate. The pain has been Intermittent since onset. Pertinent negatives include no numbness. She reports no foreign bodies present. The symptoms are aggravated by weight bearing and movement. She has tried non-weight bearing and acetaminophen for the symptoms. The treatment provided mild relief.  Foot Pain Pertinent negatives include no numbness.      Review of Systems  Neurological:  Negative for numbness.  All other systems reviewed and are negative.      Objective:   Physical Exam Vitals reviewed.  Constitutional:      General: She is not in acute distress.    Appearance: She is well-developed.  HENT:     Head: Normocephalic and atraumatic.  Eyes:     Pupils: Pupils are equal, round, and reactive to light.  Neck:     Thyroid: No thyromegaly.  Cardiovascular:     Rate and Rhythm: Normal rate and regular rhythm.     Heart sounds: Normal heart sounds. No murmur heard. Pulmonary:     Effort: Pulmonary effort is normal. No respiratory distress.     Breath sounds: Normal breath sounds. No wheezing.  Abdominal:     General: Bowel sounds are normal. There is no distension.     Palpations: Abdomen is soft.     Tenderness: There is no abdominal tenderness.  Musculoskeletal:        General: Tenderness  (right knee pain with flexion and extension, mild swelling) present. Normal range of motion.     Cervical back: Normal range of motion and neck supple.  Skin:    General: Skin is warm and dry.  Neurological:     Mental Status: She is alert and oriented to person, place, and time.     Cranial Nerves: No cranial nerve deficit.     Deep Tendon Reflexes: Reflexes are normal and symmetric.  Psychiatric:        Behavior: Behavior normal.        Thought Content: Thought content normal.        Judgment: Judgment normal.     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 Acute pain of right knee - diclofenac (VOLTAREN) 75 MG EC tablet; Take 1 tablet (75 mg total) by mouth 2 (two) times daily.  Dispense: 30 tablet; Refill: 0 - predniSONE (STERAPRED UNI-PAK 21 TAB) 10 MG (21) TBPK tablet; Use as directed  Dispense: 21 tablet; Refill: 0 - DG Knee 1-2 Views Right  2. Bilateral foot pain - diclofenac (VOLTAREN) 75 MG EC tablet; Take 1 tablet (75 mg total) by mouth 2 (two) times daily.  Dispense: 30 tablet; Refill: 0 - predniSONE (STERAPRED UNI-PAK 21 TAB) 10 MG (21) TBPK tablet; Use as directed  Dispense: 21 tablet; Refill: 0  3. Obesity, morbid (HCC)   Rest Ice Diclofenac BID with food, no other NSAID's Good shoe support Follow up if symptoms worsen or do not improve    Evelina Dun, FNP

## 2022-09-12 NOTE — Patient Instructions (Signed)
Plantar Fasciitis  Plantar fasciitis is a painful foot condition that affects the heel. It occurs when the band of tissue that connects the toes to the heel bone (plantar fascia) becomes irritated. This can happen as the result of exercising too much or doing other repetitive activities (overuse injury). Plantar fasciitis can cause mild irritation to severe pain that makes it difficult to walk or move. The pain is usually worse in the morning after sleeping, or after sitting or lying down for a period of time. Pain may also be worse after long periods of walking or standing. What are the causes? This condition may be caused by: Standing for long periods of time. Wearing shoes that do not have good arch support. Doing activities that put stress on joints (high-impact activities). This includes ballet and exercise that makes your heart beat faster (aerobic exercise), such as running. Being overweight. An abnormal way of walking (gait). Tight muscles in the back of your lower leg (calf). High arches in your feet or flat feet. Starting a new athletic activity. What are the signs or symptoms? The main symptom of this condition is heel pain. Pain may get worse after the following: Taking the first steps after a time of rest, especially in the morning after awakening, or after you have been sitting or lying down for a while. Long periods of standing still. Pain may decrease after 30-45 minutes of activity, such as gentle walking. How is this diagnosed? This condition may be diagnosed based on your medical history, a physical exam, and your symptoms. Your health care provider will check for: A tender area on the bottom of your foot. A high arch in your foot or flat feet. Pain when you move your foot. Difficulty moving your foot. You may have imaging tests to confirm the diagnosis, such as: X-rays. Ultrasound. MRI. How is this treated? Treatment for plantar fasciitis depends on how severe your  condition is. Treatment may include: Rest, ice, pressure (compression), and raising (elevating) the affected foot. This is called RICE therapy. Your health care provider may recommend RICE therapy along with over-the-counter pain medicines to manage your pain. Exercises to stretch your calves and your plantar fascia. A splint that holds your foot in a stretched, upward position while you sleep (night splint). Physical therapy to relieve symptoms and prevent problems in the future. Injections of steroid medicine (cortisone) to relieve pain and inflammation. Stimulating your plantar fascia with electrical impulses (extracorporeal shock wave therapy). This is usually the last treatment option before surgery. Surgery, if other treatments have not worked after 12 months. Follow these instructions at home: Managing pain, stiffness, and swelling  If directed, put ice on the painful area. To do this: Put ice in a plastic bag, or use a frozen bottle of water. Place a towel between your skin and the bag or bottle. Roll the bottom of your foot over the bag or bottle. Do this for 20 minutes, 2-3 times a day. Wear athletic shoes that have air-sole or gel-sole cushions, or try soft shoe inserts that are designed for plantar fasciitis. Elevate your foot above the level of your heart while you are sitting or lying down. Activity Avoid activities that cause pain. Ask your health care provider what activities are safe for you. Do physical therapy exercises and stretches as told by your health care provider. Try activities and forms of exercise that are easier on your joints (low impact). Examples include swimming, water aerobics, and biking. General instructions Take over-the-counter   and prescription medicines only as told by your health care provider. Wear a night splint while sleeping, if told by your health care provider. Loosen the splint if your toes tingle, become numb, or turn cold and blue. Maintain a  healthy weight, or work with your health care provider to lose weight as needed. Keep all follow-up visits. This is important. Contact a health care provider if you have: Symptoms that do not go away with home treatment. Pain that gets worse. Pain that affects your ability to move or do daily activities. Summary Plantar fasciitis is a painful foot condition that affects the heel. It occurs when the band of tissue that connects the toes to the heel bone (plantar fascia) becomes irritated. Heel pain is the main symptom of this condition. It may get worse after exercising too much or standing still for a long time. Treatment varies, but it usually starts with rest, ice, pressure (compression), and raising (elevating) the affected foot. This is called RICE therapy. Over-the-counter medicines can also be used to manage pain. This information is not intended to replace advice given to you by your health care provider. Make sure you discuss any questions you have with your health care provider. Document Revised: 02/09/2020 Document Reviewed: 02/09/2020 Elsevier Patient Education  2023 Elsevier Inc.  

## 2022-10-05 ENCOUNTER — Ambulatory Visit (INDEPENDENT_AMBULATORY_CARE_PROVIDER_SITE_OTHER): Payer: Commercial Managed Care - PPO | Admitting: Family

## 2022-10-05 ENCOUNTER — Encounter: Payer: Self-pay | Admitting: Family

## 2022-10-05 VITALS — BP 107/67 | HR 77 | Temp 97.9°F | Ht 67.0 in | Wt 315.6 lb

## 2022-10-05 DIAGNOSIS — E559 Vitamin D deficiency, unspecified: Secondary | ICD-10-CM | POA: Diagnosis not present

## 2022-10-05 DIAGNOSIS — Z0000 Encounter for general adult medical examination without abnormal findings: Secondary | ICD-10-CM | POA: Diagnosis not present

## 2022-10-05 DIAGNOSIS — Z713 Dietary counseling and surveillance: Secondary | ICD-10-CM

## 2022-10-05 DIAGNOSIS — G4733 Obstructive sleep apnea (adult) (pediatric): Secondary | ICD-10-CM | POA: Diagnosis not present

## 2022-10-05 DIAGNOSIS — K2100 Gastro-esophageal reflux disease with esophagitis, without bleeding: Secondary | ICD-10-CM

## 2022-10-05 DIAGNOSIS — J45909 Unspecified asthma, uncomplicated: Secondary | ICD-10-CM

## 2022-10-05 MED ORDER — SEMAGLUTIDE-WEIGHT MANAGEMENT 0.5 MG/0.5ML ~~LOC~~ SOAJ: 0.5000 mg | SUBCUTANEOUS | 0 refills | Status: AC

## 2022-10-05 MED ORDER — SEMAGLUTIDE-WEIGHT MANAGEMENT 1 MG/0.5ML ~~LOC~~ SOAJ: 1.0000 mg | SUBCUTANEOUS | 0 refills | Status: AC

## 2022-10-05 MED ORDER — SEMAGLUTIDE-WEIGHT MANAGEMENT 0.25 MG/0.5ML ~~LOC~~ SOAJ: 0.2500 mg | SUBCUTANEOUS | 0 refills | Status: AC

## 2022-10-05 NOTE — Patient Instructions (Signed)

## 2022-10-05 NOTE — Progress Notes (Signed)
Subjective:    Patient ID: Barbara Hinton, female    DOB: September 20, 1971, 51 y.o.   MRN: [id number]  Chief Complaint  Patient presents with   Annual Exam    Discuss wt loss    Pt presents to the office today for CPE without pap. She is morbid obese with a BMI of 53. She wants to discuss weight loss medications.   She is followed by GI annually.   She has OSA and uses CPAP.  Asthma She complains of cough and wheezing. There is no shortness of breath. This is a chronic problem. The current episode started more than 1 year ago. The problem occurs intermittently. Associated symptoms include heartburn. Her symptoms are alleviated by rest. She reports minimal improvement on treatment. Her symptoms are not alleviated by rest. Her past medical history is significant for asthma.  Gastroesophageal Reflux She complains of belching, coughing, heartburn and wheezing. This is a chronic problem. The current episode started more than 1 year ago. The problem occurs occasionally. Risk factors include obesity. She has tried a PPI for the symptoms. The treatment provided moderate relief.      Review of Systems  Respiratory:  Positive for cough and wheezing. Negative for shortness of breath.   Gastrointestinal:  Positive for heartburn.  All other systems reviewed and are negative.   Family History  Problem Relation Age of Onset   Diabetes Mother    Diabetes Father    Hypertension Father    Cirrhosis Brother        liver transplant   Hypertension Brother    Heart attack Maternal Grandfather    Heart attack Paternal Grandfather    Colon cancer Neg Hx    Colon polyps Neg Hx    Social History   Socioeconomic History   Marital status: Married    Spouse name: Not on file   Number of children: Not on file   Years of education: Not on file   Highest education level: Not on file  Occupational History   Not on file  Tobacco Use   Smoking status: Never    Passive exposure: Yes   Smokeless  tobacco: Never  Substance and Sexual Activity   Alcohol use: No   Drug use: No   Sexual activity: Not on file  Other Topics Concern   Not on file  Social History Narrative   Not on file   Social Determinants of Health   Financial Resource Strain: Not on file  Food Insecurity: Not on file  Transportation Needs: Not on file  Physical Activity: Not on file  Stress: Not on file  Social Connections: Not on file       Objective:   Physical Exam Vitals reviewed.  Constitutional:      General: She is not in acute distress.    Appearance: She is well-developed. She is morbidly obese.  HENT:     Head: Normocephalic and atraumatic.     Right Ear: Tympanic membrane normal.     Left Ear: Tympanic membrane normal.  Eyes:     Pupils: Pupils are equal, round, and reactive to light.  Neck:     Thyroid: No thyromegaly.  Cardiovascular:     Rate and Rhythm: Normal rate and regular rhythm.     Heart sounds: Normal heart sounds. No murmur heard. Pulmonary:     Effort: Pulmonary effort is normal. No respiratory distress.     Breath sounds: Normal breath sounds. No wheezing.  Abdominal:  General: Bowel sounds are normal. There is no distension.     Palpations: Abdomen is soft.     Tenderness: There is no abdominal tenderness.  Musculoskeletal:        General: No tenderness. Normal range of motion.     Cervical back: Normal range of motion and neck supple.  Skin:    General: Skin is warm and dry.  Neurological:     Mental Status: She is alert and oriented to person, place, and time.     Cranial Nerves: No cranial nerve deficit.     Deep Tendon Reflexes: Reflexes are normal and symmetric.  Psychiatric:        Behavior: Behavior normal.        Thought Content: Thought content normal.        Judgment: Judgment normal.      BP 107/67   Pulse 77   Temp 97.9 F (36.6 C) (Temporal)   Ht _0  (1.702 m)   Wt (!) 315 lb 9.6 oz (143.2 kg)   SpO2 94%   BMI 49.43 kg/m        Assessment & Plan:   Barbara Hinton comes in today with chief complaint of Annual Exam (Discuss wt loss )   Diagnosis and orders addressed:  1. Annual physical exam - BMP8+EGFR - CBC with Differential/Platelet - Lipid panel - TSH  2. OSA (obstructive sleep apnea) - BMP8+EGFR - CBC with Differential/Platelet  3. Vitamin D deficiency - BMP8+EGFR - CBC with Differential/Platelet  4. Obesity, morbid ([place]) - BMP8+EGFR - CBC with Differential/Platelet - Semaglutide-Weight Management 0.25 MG/0.5ML SOAJ; Inject 0.25 mg into the skin once a week for 28 days.  Dispense: 2 mL; Refill: 0 - Semaglutide-Weight Management 0.5 MG/0.5ML SOAJ; Inject 0.5 mg into the skin once a week for 28 days.  Dispense: 2 mL; Refill: 0 - Semaglutide-Weight Management 1 MG/0.5ML SOAJ; Inject 1 mg into the skin once a week for 28 days.  Dispense: 2 mL; Refill: 0  5. Gastroesophageal reflux disease with esophagitis without hemorrhage - BMP8+EGFR - CBC with Differential/Platelet  6. Mild asthma without complication, unspecified whether persistent - BMP8+EGFR - CBC with Differential/Platelet  7. Weight loss counseling, encounter for Will start Wegovy 0.25 mg for one month then increase to 0.5 mg, and 1 mg.  Encourage healthy diet and exercise  - BMP8+EGFR - CBC with Differential/Platelet - Semaglutide-Weight Management 0.25 MG/0.5ML SOAJ; Inject 0.25 mg into the skin once a week for 28 days.  Dispense: 2 mL; Refill: 0 - Semaglutide-Weight Management 0.5 MG/0.5ML SOAJ; Inject 0.5 mg into the skin once a week for 28 days.  Dispense: 2 mL; Refill: 0 - Semaglutide-Weight Management 1 MG/0.5ML SOAJ; Inject 1 mg into the skin once a week for 28 days.  Dispense: 2 mL; Refill: 0   Labs pending Health Maintenance reviewed Diet and exercise encouraged  Follow up plan: 3 months    Evelina Dun, FNP

## 2022-10-06 ENCOUNTER — Other Ambulatory Visit: Payer: Self-pay | Admitting: Family

## 2022-10-06 DIAGNOSIS — J45909 Unspecified asthma, uncomplicated: Secondary | ICD-10-CM

## 2022-10-06 LAB — LIPID PANEL
Chol/HDL Ratio: 3.8 ratio (ref 0.0–4.4)
Cholesterol, Total: 159 mg/dL (ref 100–199)
HDL: 42 mg/dL (ref >39)
LDL Chol Calc (NIH): 96 mg/dL (ref 0–99)
Triglycerides: 113 mg/dL (ref 0–149)
VLDL Cholesterol Cal: 21 mg/dL (ref 5–40)

## 2022-10-06 LAB — CBC WITH DIFFERENTIAL/PLATELET
Basophils Absolute: 0 10*3/uL (ref 0.0–0.2)
Basos: 0 %
EOS (ABSOLUTE): 0.1 10*3/uL (ref 0.0–0.4)
Eos: 3 %
Hematocrit: 41.1 % (ref 34.0–46.6)
Hemoglobin: 13.4 g/dL (ref 11.1–15.9)
Immature Grans (Abs): 0 10*3/uL (ref 0.0–0.1)
Immature Granulocytes: 0 %
Lymphocytes Absolute: 1.6 10*3/uL (ref 0.7–3.1)
Lymphs: 32 %
MCH: 27.9 pg (ref 26.6–33.0)
MCHC: 32.6 g/dL (ref 31.5–35.7)
MCV: 86 fL (ref 79–97)
Monocytes Absolute: 0.3 10*3/uL (ref 0.1–0.9)
Monocytes: 7 %
Neutrophils Absolute: 3 10*3/uL (ref 1.4–7.0)
Neutrophils: 58 %
Platelets: 260 10*3/uL (ref 150–450)
RBC: 4.8 x10E6/uL (ref 3.77–5.28)
RDW: 13.2 % (ref 11.7–15.4)
WBC: 5.1 10*3/uL (ref 3.4–10.8)

## 2022-10-06 LAB — BMP8+EGFR
BUN/Creatinine Ratio: 14 (ref 9–23)
BUN: 11 mg/dL (ref 6–24)
CO2: 24 mmol/L (ref 20–29)
Calcium: 9.7 mg/dL (ref 8.7–10.2)
Chloride: 103 mmol/L (ref 96–106)
Creatinine, Ser: 0.81 mg/dL (ref 0.57–1.00)
Glucose: 141 mg/dL — ABNORMAL HIGH (ref 70–99)
Potassium: 4.5 mmol/L (ref 3.5–5.2)
Sodium: 139 mmol/L (ref 134–144)
eGFR: 89 mL/min/{1.73_m2} (ref >59)

## 2022-10-06 LAB — TSH: TSH: 2.12 u[IU]/mL (ref 0.450–4.500)

## 2022-10-09 DIAGNOSIS — E559 Vitamin D deficiency, unspecified: Secondary | ICD-10-CM

## 2022-10-09 DIAGNOSIS — J45909 Unspecified asthma, uncomplicated: Secondary | ICD-10-CM

## 2022-10-09 MED ORDER — FLUTICASONE-SALMETEROL 100-50 MCG/ACT IN AEPB: INHALATION_SPRAY | RESPIRATORY_TRACT | 1 refills | Status: DC

## 2022-10-09 MED ORDER — PANTOPRAZOLE SODIUM 40 MG PO TBEC: 40.0000 mg | DELAYED_RELEASE_TABLET | Freq: Every day | ORAL | 3 refills | Status: DC

## 2022-10-09 MED ORDER — VITAMIN D (ERGOCALCIFEROL) 1.25 MG (50000 UNIT) PO CAPS: 50000.0000 [IU] | ORAL_CAPSULE | ORAL | 0 refills | Status: DC

## 2022-10-09 NOTE — Progress Notes (Deleted)
Primary Care Physician:  Sharion Balloon, FNP  Primary Gastroenterologist: Elon Alas.Abbey Chatters, DO; Cristopher Estimable. Rourk, MD;  Maylon Peppers, MD***  Patient Location: Home Reason for Visit: ***  Persons present on the virtual encounter, with roles: Patient - Barbara Hinton; Provider - Venetia Night, NP   Total time (minutes) spent on medical discussion: *** minutes  Virtual Visit Encounter Note Visit is conducted virtually and was requested by patient.   I connected with Barbara Hinton on 10/09/22 at  8:00 AM EST by video*** and verified that I am speaking with the correct person using two identifiers.   I discussed the limitations, risks, security and privacy concerns of performing an evaluation and management service by video*** and the availability of in person appointments. I also discussed with the patient that there may be a patient responsible charge related to this service. The patient expressed understanding and agreed to proceed.  No chief complaint on file.    History of Present Illness: Barbara Hinton is a 51 y.o. female with a history of ***   Last office visit 04/11/2022. ***     Medications No outpatient medications have been marked as taking for the 10/11/22 encounter (Appointment) with Sherron Monday, NP.     History Past Medical History:  Diagnosis Date   Asthma    OSA (obstructive sleep apnea) 12/04/2017   Mild with AHI overall 9.8/hr but during REM sleep severe with AHI 53.8/hr and nocturnal hypoxemia now on CPAP at 10cm H2O.      Past Surgical History:  Procedure Laterality Date   CESAREAN SECTION      Family History  Problem Relation Age of Onset   Diabetes Mother    Diabetes Father    Hypertension Father    Cirrhosis Brother        liver transplant   Hypertension Brother    Heart attack Maternal Grandfather    Heart attack Paternal Grandfather    Colon cancer Neg Hx    Colon polyps Neg Hx     Social History   Socioeconomic  History   Marital status: Married    Spouse name: Not on file   Number of children: Not on file   Years of education: Not on file   Highest education level: Not on file  Occupational History   Not on file  Tobacco Use   Smoking status: Never    Passive exposure: Yes   Smokeless tobacco: Never  Substance and Sexual Activity   Alcohol use: No   Drug use: No   Sexual activity: Not on file  Other Topics Concern   Not on file  Social History Narrative   Not on file   Social Determinants of Health   Financial Resource Strain: Not on file  Food Insecurity: Not on file  Transportation Needs: Not on file  Physical Activity: Not on file  Stress: Not on file  Social Connections: Not on file      Review of Systems: Gen: Denies fever, chills, anorexia. Denies fatigue, weakness, weight loss.  CV: Denies chest pain, palpitations, syncope, peripheral edema, and claudication. Resp: Denies dyspnea at rest, cough, wheezing, coughing up blood, and pleurisy. GI: see HPI Derm: Denies rash, itching, dry skin Psych: Denies depression, anxiety, memory loss, confusion. No homicidal or suicidal ideation.  Heme: Denies bruising, bleeding, and enlarged lymph nodes.  Observations/Objective: No distress. Alert and oriented. Pleasant. Well nourished. Normal mood and affect. Unable to perform complete physical exam due  to video*** encounter.   Assessment:    Plan:      Follow Up Instructions:  I discussed the assessment and treatment plan with the patient. The patient was provided an opportunity to ask questions and all were answered. The patient agreed with the plan and demonstrated an understanding of the instructions.   The patient was advised to call back or seek an in-person evaluation if the symptoms worsen or if the condition fails to improve as anticipated.    Venetia Night, MSN, APRN, FNP-BC, AGACNP-BC [facility]

## 2022-10-11 ENCOUNTER — Telehealth: Payer: Commercial Managed Care - PPO | Admitting: Gastroenterology

## 2022-11-08 NOTE — Progress Notes (Deleted)
Primary Care Physician:  Sharion Balloon, FNP  Primary Gastroenterologist: Elon Alas.Abbey Chatters, DO; Cristopher Estimable. Rourk, MD;  Maylon Peppers, MD***  Patient Location: Home Reason for Visit: ***  Persons present on the virtual encounter, with roles: Patient - Barbara Hinton; Provider - Venetia Night, NP   Total time (minutes) spent on medical discussion: ### minutes  Virtual Visit Encounter Note Visit is conducted virtually and was requested by patient.   I connected with Barbara Hinton on 11/08/22 at  3:30 PM EST by video*** and verified that I am speaking with the correct person using two identifiers.   I discussed the limitations, risks, security and privacy concerns of performing an evaluation and management service by video*** and the availability of in person appointments. I also discussed with the patient that there may be a patient responsible charge related to this service. The patient expressed understanding and agreed to proceed.  No chief complaint on file.    History of Present Illness: Barbara Hinton is a 52 y.o. female with a history of ***   Last office visit 04/11/2022. ***    Medications No outpatient medications have been marked as taking for the 11/09/22 encounter (Appointment) with Sherron Monday, NP.     History Past Medical History:  Diagnosis Date   Asthma    OSA (obstructive sleep apnea) 12/04/2017   Mild with AHI overall 9.8/hr but during REM sleep severe with AHI 53.8/hr and nocturnal hypoxemia now on CPAP at 10cm H2O.      Past Surgical History:  Procedure Laterality Date   CESAREAN SECTION      Family History  Problem Relation Age of Onset   Diabetes Mother    Diabetes Father    Hypertension Father    Cirrhosis Brother        liver transplant   Hypertension Brother    Heart attack Maternal Grandfather    Heart attack Paternal Grandfather    Colon cancer Neg Hx    Colon polyps Neg Hx     Social History   Socioeconomic History    Marital status: Married    Spouse name: Not on file   Number of children: Not on file   Years of education: Not on file   Highest education level: Not on file  Occupational History   Not on file  Tobacco Use   Smoking status: Never    Passive exposure: Yes   Smokeless tobacco: Never  Substance and Sexual Activity   Alcohol use: No   Drug use: No   Sexual activity: Not on file  Other Topics Concern   Not on file  Social History Narrative   Not on file   Social Determinants of Health   Financial Resource Strain: Not on file  Food Insecurity: Not on file  Transportation Needs: Not on file  Physical Activity: Not on file  Stress: Not on file  Social Connections: Not on file      Review of Systems: Gen: Denies fever, chills, anorexia. Denies fatigue, weakness, weight loss.  CV: Denies chest pain, palpitations, syncope, peripheral edema, and claudication. Resp: Denies dyspnea at rest, cough, wheezing, coughing up blood, and pleurisy. GI: see HPI Derm: Denies rash, itching, dry skin Psych: Denies depression, anxiety, memory loss, confusion. No homicidal or suicidal ideation.  Heme: Denies bruising, bleeding, and enlarged lymph nodes.  Observations/Objective: No distress. Alert and oriented. Pleasant. Well nourished. Normal mood and affect. Unable to perform complete physical exam due to  video*** encounter.   Assessment:    Plan:      Follow Up Instructions:  I discussed the assessment and treatment plan with the patient. The patient was provided an opportunity to ask questions and all were answered. The patient agreed with the plan and demonstrated an understanding of the instructions.   The patient was advised to call back or seek an in-person evaluation if the symptoms worsen or if the condition fails to improve as anticipated.    Venetia Night, MSN, APRN, FNP-BC, AGACNP-BC [facility]

## 2022-11-09 ENCOUNTER — Telehealth: Payer: Commercial Managed Care - PPO | Admitting: Gastroenterology

## 2022-11-24 ENCOUNTER — Other Ambulatory Visit: Payer: Self-pay | Admitting: Family

## 2022-11-24 DIAGNOSIS — Z1231 Encounter for screening mammogram for malignant neoplasm of breast: Secondary | ICD-10-CM

## 2022-12-04 ENCOUNTER — Ambulatory Visit
Admission: RE | Admit: 2022-12-04 | Discharge: 2022-12-04 | Disposition: A | Discharge status: Home / Self Care | Payer: Commercial Managed Care - PPO | Source: Ambulatory Visit | Attending: Family | Admitting: Family

## 2022-12-04 DIAGNOSIS — Z1231 Encounter for screening mammogram for malignant neoplasm of breast: Secondary | ICD-10-CM

## 2022-12-05 ENCOUNTER — Ambulatory Visit (INDEPENDENT_AMBULATORY_CARE_PROVIDER_SITE_OTHER): Payer: Commercial Managed Care - PPO

## 2022-12-05 ENCOUNTER — Encounter (HOSPITAL_BASED_OUTPATIENT_CLINIC_OR_DEPARTMENT_OTHER): Payer: Self-pay | Admitting: Cardiology

## 2022-12-05 ENCOUNTER — Ambulatory Visit: Payer: Commercial Managed Care - PPO | Attending: Internal Medicine | Admitting: Internal Medicine

## 2022-12-05 VITALS — BP 122/78 | HR 87 | Ht 61.0 in | Wt 314.6 lb

## 2022-12-05 DIAGNOSIS — R002 Palpitations: Secondary | ICD-10-CM

## 2022-12-05 NOTE — Patient Instructions (Addendum)
Medication Instructions:  Your physician recommends that you continue on your current medications as directed. Please refer to the Current Medication list given to you today.  *If you need a refill on your cardiac medications before your next appointment, please call your pharmacy*  Lab Work: NONE  Testing/Procedures: Your physician has requested that you wear a Zio heart monitor for 14 days. This will be mailed to your home with instructions on how to apply the monitor and how to return it when finished. Please allow 2 weeks after returning the heart monitor before our office calls you with the results.   Follow-Up: We will call you with your heart monitor results.  Other Instructions ZIO XT- Long Term Monitor Instructions     Your physician has requested you wear a ZIO patch monitor for 14 days.  This is a single patch monitor. [employer] supplies one patch monitor per enrollment. Additional  stickers are not available. Please do not apply patch if you will be having a Nuclear Stress Test,  Echocardiogram, Cardiac CT, MRI, or Chest Xray during the period you would be wearing the  monitor. The patch cannot be worn during these tests. You cannot remove and re-apply the  ZIO XT patch monitor.  Your ZIO patch monitor will be mailed 3 day USPS to your address on file. It may take 3-5 days  to receive your monitor after you have been enrolled.  Once you have received your monitor, please review the enclosed instructions. Your monitor  has already been registered assigning a specific monitor serial # to you.     Billing and Patient Assistance Program Information     We have supplied [employer] with any of your insurance information on file for billing purposes.  [employer] offers a sliding scale Patient Assistance Program for patients that do not have  insurance, or whose insurance does not completely cover the cost of the ZIO monitor.  You must apply for the Patient Assistance Program to qualify  for this discounted rate.  To apply, please call [employer] at [phone], select option 4, select option 2, ask to apply for  Patient Assistance Program. Theodore Demark will ask your household income, and how many people  are in your household. They will quote your out-of-pocket cost based on that information.  [employer] will also be able to set up a 63-month interest-free payment plan if needed.     Applying the monitor     Shave hair from upper left chest.  Hold abrader disc by orange tab. Rub abrader in 40 strokes over the upper left chest as  indicated in your monitor instructions.  Clean area with 4 enclosed alcohol pads. Let dry.  Apply patch as indicated in monitor instructions. Patch will be placed under collarbone on left  side of chest with arrow pointing upward.  Rub patch adhesive wings for 2 minutes. Remove white label marked "1". Remove the white  label marked "2". Rub patch adhesive wings for 2 additional minutes.  While looking in a mirror, press and release button in center of patch. A small green light will  flash 3-4 times. This will be your only indicator that the monitor has been turned on.  Do not shower for the first 24 hours. You may shower after the first 24 hours.  Press the button if you feel a symptom. You will hear a small click. Record Date, Time and  Symptom in the Patient Logbook.  When you are ready to remove the patch, follow instructions  on the last 2 pages of Patient  Logbook. Stick patch monitor onto the last page of Patient Logbook.  Place Patient Logbook in the blue and white box. Use locking tab on box and tape box closed  securely. The blue and white box has prepaid postage on it. Please place it in the mailbox as  soon as possible. Your physician should have your test results approximately 7 days after the  monitor has been mailed back to [facility].  Call [place] at [phone] if you have questions regarding  your ZIO XT patch  monitor. Call them immediately if you see an orange light blinking on your  monitor.  If your monitor falls off in less than 4 days, contact our Monitor department at [phone].  If your monitor becomes loose or falls off after 4 days call [employer] at [phone] for  suggestions on securing your monitor.

## 2022-12-05 NOTE — Progress Notes (Unsigned)
Enrolled patient for a 14 day Zio XT  monitor to be mailed to patients home  °

## 2022-12-05 NOTE — Progress Notes (Unsigned)
   Nurse Visit   Date of Encounter: 12/05/2022 ID: Barbara Hinton, DOB November 22, 1970, MRN [id number]  PCP:  Sharion Balloon, Shauntel Prest Providers Cardiologist:  None Sleep Medicine:  Fransico Him, MD { Click to update primary MD,subspecialty MD or APP then REFRESH:1}     Visit Details   VS:  BP 122/78 (BP Location: Left Arm, Patient Position: Sitting)   Pulse 87   Ht '5\' 1"'$  (1.549 m)   Wt (!) 314 lb 9.6 oz (142.7 kg)   SpO2 96%   BMI 59.44 kg/m  , BMI Body mass index is 59.44 kg/m.  Wt Readings from Last 3 Encounters:  12/05/22 (!) 314 lb 9.6 oz (142.7 kg)  10/05/22 (!) 315 lb 9.6 oz (143.2 kg)  09/12/22 (!) 312 lb (141.5 kg)     Reason for visit: Heart fluttering sensation, EKG per Dr. Radford Pax Performed today: Vitals, EKG, and Provider consulted:Dr. Fransico Him Changes (medications, testing, etc.) : None Length of Visit: 45 minutes    Medications Adjustments/Labs and Tests Ordered: Orders Placed This Encounter  Procedures   LONG TERM MONITOR (3-14 DAYS)   No orders of the defined types were placed in this encounter.  Patient with complaint of heart fluttering sensation since 12/01/2022. She states these sensations come and go, and occur at rest or with activity. She denies any particular pattern to onset, she states the sensation only lasts a few seconds. She reports occasional SOB and occasional burning sensation in chest. She has stopped drinking caffeinated beverages for the last 2 days with no resolution in palpitations. She denies any palpitations during nurse visit. EKG performed and read by Dr. Radford Pax. EKG shows normal sinus rhythm. Dr. Radford Pax ordered a 2 week Zio heart monitor for palpitations. Heart monitor will be mailed to patient, mailing address confirmed.  Signed, Molli Barrows, RN  12/05/2022 2:54 PM

## 2022-12-07 ENCOUNTER — Telehealth (INDEPENDENT_AMBULATORY_CARE_PROVIDER_SITE_OTHER): Payer: Commercial Managed Care - PPO | Admitting: Gastroenterology

## 2022-12-07 ENCOUNTER — Encounter: Payer: Self-pay | Admitting: Gastroenterology

## 2022-12-07 DIAGNOSIS — K5900 Constipation, unspecified: Secondary | ICD-10-CM | POA: Diagnosis not present

## 2022-12-07 DIAGNOSIS — Z1211 Encounter for screening for malignant neoplasm of colon: Secondary | ICD-10-CM

## 2022-12-07 DIAGNOSIS — K219 Gastro-esophageal reflux disease without esophagitis: Secondary | ICD-10-CM | POA: Diagnosis not present

## 2022-12-07 DIAGNOSIS — K760 Fatty (change of) liver, not elsewhere classified: Secondary | ICD-10-CM | POA: Diagnosis not present

## 2022-12-07 DIAGNOSIS — Z1212 Encounter for screening for malignant neoplasm of rectum: Secondary | ICD-10-CM

## 2022-12-07 NOTE — Progress Notes (Signed)
Primary Care Physician:  Sharion Balloon, FNP  Primary Gastroenterologist: Elon Alas.Abbey Chatters, DO  Patient Location: Home Reason for Visit: follow up  Persons present on the virtual encounter, with roles: Patient - Barbara Hinton; Provider - Venetia Night, NP   Total time (minutes) spent on medical discussion: 15 minutes  Virtual Visit Encounter Note Visit is conducted virtually and was requested by patient.   I connected with Barbara Hinton on 12/07/22 at  3:30 PM EST by video and verified that I am speaking with the correct person using two identifiers.   I discussed the limitations, risks, security and privacy concerns of performing an evaluation and management service by video and the availability of in person appointments. I also discussed with the patient that there may be a patient responsible charge related to this service. The patient expressed understanding and agreed to proceed.  No chief complaint on file.   History of Present Illness: Barbara Hinton is a 52 y.o. female with a history of  asthma, OSA, constipation, fatty liver presenting for video visit today for follow up.   Last office visit 04/11/2022.  Reported difficulty completing Cologuard due to finding a [employer] facility to send sample.  Having nausea once or twice per week, taking Protonix daily.  Frequent belching with eating.  Dr Malachi Bonds makes her sick.  Drinks water and tea in the mornings and is cut out spicy foods and that seems to have improved her reflux.  Working on diet changes.  Occasionally feel if she could burp, feeling air stuck in her chest at times.  Doing okay with constipation and having at least 1 BM daily, sometimes 2.  Cut back on fiber.  Some mild incomplete emptying.  Abdominal pain improved since she stopped lifting heavy things at work.  Given history of fatty liver and mildly elevated liver enzymes, HFP checked.  Advised to continue PPI daily, Gas-X as needed for belching, continue fiber daily.   If incomplete emptying lower abdominal discomfort she may take MiraLAX.  Advised to complete Cologuard and follow-up in 6 months or sooner if needed.  Lab 04/12/2022 with normal LFTs. Cologuard not yet completed.  Today:  GERD is controlled on pantoprazole once daily , forgets at times. No breakthrough symptoms, N/V.  Has good appetite. Still having belching and some excess gas. Sometimes gas ex and sometimes tums. Gives her good relief here and there.   No dysphagia. Does have some occasional heart palpitations/fluttering that feels normal that her baseline. Seen cardiology yesterday. Is going to wear zioptach for 2 weeks. Today not fluttering. Stopped pantoprazole briefly. Some dizziness with fluttering and having a cough but now not coughing or having any fluttering. No chest pain. Has a burning sensation once but felt like that may have been heartburn. EKG done was normal.   Constipation going on her own. BM everyday sometimes twice a day. No more abdominal pain. No fiber right now.    Still difficulty with getting sample. No family history of colon cancer or colon polyps.   Medications Current Meds  Medication Sig   albuterol (VENTOLIN HFA) 108 (90 Base) MCG/ACT inhaler TAKE 2 PUFFS BY MOUTH EVERY 6 HOURS AS NEEDED FOR WHEEZE OR SHORTNESS OF BREATH   aspirin 81 MG tablet Take 81 mg by mouth 2 (two) times a week.   fluticasone-salmeterol (ADVAIR DISKUS) 100-50 MCG/ACT AEPB INHALE 1 PUFF INTO THE LUNGS TWICE A DAY   pantoprazole (PROTONIX) 40 MG tablet Take 1 tablet (40  mg total) by mouth daily. 30 minutes before breakfast   Vitamin D, Ergocalciferol, (DRISDOL) 1.25 MG (50000 UNIT) CAPS capsule Take 1 capsule (50,000 Units total) by mouth every 7 (seven) days.      History Past Medical History:  Diagnosis Date   Asthma    OSA (obstructive sleep apnea) 12/04/2017   Mild with AHI overall 9.8/hr but during REM sleep severe with AHI 53.8/hr and nocturnal hypoxemia now on CPAP at 10cm H2O.       Past Surgical History:  Procedure Laterality Date   CESAREAN SECTION      Family History  Problem Relation Age of Onset   Diabetes Mother    Diabetes Father    Hypertension Father    Cirrhosis Brother        liver transplant   Hypertension Brother    Heart attack Maternal Grandfather    Heart attack Paternal Grandfather    Colon cancer Neg Hx    Colon polyps Neg Hx     Social History   Socioeconomic History   Marital status: Married    Spouse name: Not on file   Number of children: Not on file   Years of education: Not on file   Highest education level: Not on file  Occupational History   Not on file  Tobacco Use   Smoking status: Never    Passive exposure: Yes   Smokeless tobacco: Never  Substance and Sexual Activity   Alcohol use: No   Drug use: No   Sexual activity: Not on file  Other Topics Concern   Not on file  Social History Narrative   Not on file   Social Determinants of Health   Financial Resource Strain: Not on file  Food Insecurity: Not on file  Transportation Needs: Not on file  Physical Activity: Not on file  Stress: Not on file  Social Connections: Not on file      Review of Systems: Gen: Denies fever, chills, anorexia. Denies fatigue, weakness, weight loss.  CV: Denies chest pain, palpitations, syncope, peripheral edema, and claudication. Resp: Denies dyspnea at rest, cough, wheezing, coughing up blood, and pleurisy. GI: see HPI Derm: Denies rash, itching, dry skin Psych: Denies depression, anxiety, memory loss, confusion. No homicidal or suicidal ideation.  Heme: Denies bruising, bleeding, and enlarged lymph nodes.  Observations/Objective: No distress. Alert and oriented. Pleasant. Well nourished. Normal mood and affect. Unable to perform complete physical exam due to video encounter.   Assessment:  Hepatic steatosis: No significant weight loss or weight gain.  History of elevated LFTs early in 2023.  Advised to follow fatty  liver diet including low-sodium.  Ongoing weight loss and regular exercise can improve fatty liver.  Most recent labs performed after last office visit in June with normal LFTs.  Does have a history of cirrhosis and her brother who had a liver transplant.  Constipation: BM daily, sometimes twice daily.  Denies any straining or abdominal pain.  Not currently taking any fiber supplement or over-the-counter stool softeners or laxatives.  GERD: Fairly well-controlled on pantoprazole once daily.  Has occasional breakthrough symptoms if she forgets to take medication has any nausea, vomiting, abdominal pain, lack of appetite, dysphagia.  Has had some occasional heart palpitations/fluttering that feels different than her normal cardiac issues however just seen cardiology with normal EKG and will wear Zio patch for 2 days.  Has not taken her pantoprazole on a few days and queries whether or not this could cause heart palpitations.  She is going to try to start back and monitor for any recurrent symptoms she is to notify the office if she begins to experience any return of symptoms, chest pain, shortness of breath.  Advised her she may take famotidine 40 mg once daily for reflux if not able to tolerate pantoprazole.  Screening colon cancer: Still has Cologuard that she needs to complete. Not interested in colonoscopy at this time. Denies family history of colon cancer or colon polyps.   Plan:  Continue pantoprazole 40 mg daily, monitor for recurrence of chest pain, fluttering, or shortness of breath and stop immediately if this occurs If side effects occur from pantoprazole, may take famotidine 40 mg once daily. GERD diet/lifestyle modifications Low-fat/low-cholesterol diet Goal 1-2 pound weight loss per week until ideal body weight achieved to avoid progression of fatty liver. Advised to complete Cologuard, advised on warning signs that would warrant colonoscopy. Follow up in 1 year, sooner if  needed   Follow Up Instructions:  I discussed the assessment and treatment plan with the patient. The patient was provided an opportunity to ask questions and all were answered. The patient agreed with the plan and demonstrated an understanding of the instructions.   The patient was advised to call back or seek an in-person evaluation if the symptoms worsen or if the condition fails to improve as anticipated.    Venetia Night, MSN, APRN, FNP-BC, AGACNP-BC [facility]

## 2022-12-07 NOTE — Patient Instructions (Addendum)
Follow a GERD diet:  Avoid fried, fatty, greasy, spicy, citrus foods. Avoid caffeine and carbonated beverages. Avoid chocolate. Try eating 4-6 small meals a day rather than 3 large meals. Do not eat within 3 hours of laying down. Prop head of bed up on wood or bricks to create a 6 inch incline.  Continue pantoprazole 40 mg once daily.  Monitor for any chest fluttering, shortness of breath.  Stop Augmentin for suppression and contact the office to let me know.  If unable to tolerate pantoprazole we will trial famotidine 40 mg once daily.  Please complete Cologuard at earliest convenience.  Monitor for any change in bowel habits, unintentional weight loss, lack of appetite, black/tarry stools, bright red blood in your stools and notify me immediately if symptoms.  You will need to proceed with colonoscopy at that point.  Ultimately colonoscopy is this form of screening for colon cancer.  Instructions for fatty liver: Recommend 1-2# weight loss per week until ideal body weight through exercise & diet. Low fat/cholesterol diet.   Avoid sweets, sodas, fruit juices, sweetened beverages like tea, etc. Gradually increase exercise from 15 min daily up to 1 hr per day 5 days/week. Limit alcohol use.  It was a pleasure to see you today. I want to create trusting relationships with patients. If you receive a survey regarding your visit,  I greatly appreciate you taking time to fill this out on paper or through your MyChart. I value your feedback.  Venetia Night, MSN, FNP-BC, AGACNP-BC [facility]

## 2022-12-10 DIAGNOSIS — R002 Palpitations: Secondary | ICD-10-CM | POA: Diagnosis not present

## 2023-01-04 ENCOUNTER — Ambulatory Visit: Payer: Commercial Managed Care - PPO | Admitting: Family

## 2023-01-08 ENCOUNTER — Telehealth: Payer: Self-pay

## 2023-01-08 ENCOUNTER — Encounter (HOSPITAL_BASED_OUTPATIENT_CLINIC_OR_DEPARTMENT_OTHER): Payer: Self-pay | Admitting: Cardiology

## 2023-01-08 DIAGNOSIS — R002 Palpitations: Secondary | ICD-10-CM

## 2023-01-08 MED ORDER — METOPROLOL TARTRATE 25 MG PO TABS: 25.0000 mg | ORAL_TABLET | Freq: Two times a day (BID) | ORAL | 2 refills | Status: DC

## 2023-01-08 NOTE — Telephone Encounter (Signed)
Spoke to patient, Patient reports her asthma is well controlled. And her metoprolol does is 50 mg daily. Metoprolol is cardioselective beta-blocker it's relatively safe as it won't interfere with beta-2 receptors that are found in the lung.  if she notices any changes in asthma symptoms reach out to [place].

## 2023-01-08 NOTE — Telephone Encounter (Signed)
Verified and sent prescription to the pharmacy on file. The patient has been notified of the result and verbalized understanding.  All questions (if any) were answered. Gershon Crane, LPN [id number] AM

## 2023-01-30 ENCOUNTER — Telehealth: Payer: Self-pay

## 2023-01-30 NOTE — Telephone Encounter (Signed)
-----   Message from Evans Lance, MD sent at 01/29/2023  9:15 PM EDT ----- Regarding: RE: Zio monitor results vs Follow up appointment Her zio is negative. I do not need to see her back. She can followup with her primary care MD. GT ----- Message ----- From: Varney Daily, RN Sent: 01/22/2023   5:08 PM EDT To: Evans Lance, MD Subject: Zio monitor results vs Follow up appointment   Hey Dr. Lovena Le,  This Pt Zio monitor results came back.  I saw Dr. Johney Frame ordered some metoprolol for this Pt.  When would you like me to schedule follow up with her s/p Zio monitor?  Thank you,  Merrilee Seashore

## 2023-02-07 ENCOUNTER — Encounter (HOSPITAL_BASED_OUTPATIENT_CLINIC_OR_DEPARTMENT_OTHER): Payer: Self-pay | Admitting: Cardiology

## 2023-03-07 ENCOUNTER — Other Ambulatory Visit: Payer: Self-pay | Admitting: Family

## 2023-03-07 DIAGNOSIS — E559 Vitamin D deficiency, unspecified: Secondary | ICD-10-CM

## 2023-04-04 ENCOUNTER — Other Ambulatory Visit: Payer: Self-pay | Admitting: *Deleted

## 2023-04-04 ENCOUNTER — Other Ambulatory Visit: Payer: Self-pay | Admitting: Family

## 2023-04-04 DIAGNOSIS — R002 Palpitations: Secondary | ICD-10-CM

## 2023-04-04 DIAGNOSIS — J45909 Unspecified asthma, uncomplicated: Secondary | ICD-10-CM

## 2023-04-04 MED ORDER — METOPROLOL TARTRATE 25 MG PO TABS: 25.0000 mg | ORAL_TABLET | Freq: Two times a day (BID) | ORAL | 3 refills | Status: DC

## 2023-06-28 ENCOUNTER — Ambulatory Visit: Payer: Commercial Managed Care - PPO | Admitting: Cardiology

## 2023-07-05 ENCOUNTER — Encounter: Payer: Self-pay | Admitting: Cardiology

## 2023-07-05 ENCOUNTER — Ambulatory Visit: Payer: Commercial Managed Care - PPO | Attending: Cardiology | Admitting: Cardiology

## 2023-07-05 VITALS — BP 116/80 | HR 66 | Ht 61.0 in | Wt 318.4 lb

## 2023-07-05 DIAGNOSIS — G4733 Obstructive sleep apnea (adult) (pediatric): Secondary | ICD-10-CM | POA: Diagnosis not present

## 2023-07-05 DIAGNOSIS — I493 Ventricular premature depolarization: Secondary | ICD-10-CM

## 2023-07-05 NOTE — Progress Notes (Addendum)
Date:  07/05/2023   ID:  Barbara, Hinton 1971/04/18, MRN [id number]  PCP:  Junie Spencer, FNP  Sleep Medicine:  Armanda Magic, MD Electrophysiologist:  None   Chief Complaint:  OSA  History of Present Illness:    Barbara Hinton is a 52 y.o. female with a hx of obesity, snoring, PVCs and excessive daytime sleepiness with an ESS of 16 who was referred by Dr. Katrinka Blazing for sleep study.  Her PSG showed mild OSA overall with an AHI of 9.8/hr but severe during REM sleep with an AHI of 53.8/hr and nocturnal hypoxemia with O2 desats as low as 81%.  She underwent CPAP titration to 10cm H2O.    She is doing well with her PAP device and thinks that she has gotten used to it.  She tolerates the full face mask and feels the pressure is adequate.  She feels tired in the am but she gets up multiple times nightly to urinate. She denies any significant mouth or nasal dryness or nasal congestion.  Her husband says that she snores at night more than before.  Prior CV studies:   The following studies were reviewed today:  PAP compliance download  Past Medical History:  Diagnosis Date   Asthma    OSA (obstructive sleep apnea) 12/04/2017   Mild with AHI overall 9.8/hr but during REM sleep severe with AHI 53.8/hr and nocturnal hypoxemia now on CPAP at 10cm H2O.     PVC (premature ventricular contraction)    Past Surgical History:  Procedure Laterality Date   CESAREAN SECTION       Current Meds  Medication Sig   albuterol (VENTOLIN HFA) 108 (90 Base) MCG/ACT inhaler TAKE 2 PUFFS BY MOUTH EVERY 6 HOURS AS NEEDED FOR WHEEZE OR SHORTNESS OF BREATH   aspirin 81 MG tablet Take 81 mg by mouth 2 (two) times a week.   fluticasone-salmeterol (WIXELA INHUB) 100-50 MCG/ACT AEPB Inhale 1 puff into the lungs 2 (two) times daily. (NEEDS TO BE SEEN BEFORE NEXT REFILL)   pantoprazole (PROTONIX) 40 MG tablet Take 1 tablet (40 mg total) by mouth daily. 30 minutes before breakfast   Vitamin D, Ergocalciferol,  (DRISDOL) 1.25 MG (50000 UNIT) CAPS capsule TAKE 1 CAPSULE (50,000 UNITS TOTAL) BY MOUTH EVERY 7 (SEVEN) DAYS     Allergies:   Patient has no known allergies.   Social History   Tobacco Use   Smoking status: Never    Passive exposure: Yes   Smokeless tobacco: Never  Substance Use Topics   Alcohol use: No   Drug use: No     Family Hx: The patient's family history includes Cirrhosis in her brother; Diabetes in her father and mother; Heart attack in her maternal grandfather and paternal grandfather; Hypertension in her brother and father. There is no history of Colon cancer or Colon polyps.  ROS:   Please see the history of present illness.     All other systems reviewed and are negative.   Labs/Other Tests and Data Reviewed:    Recent Labs: 10/05/2022: BUN 11; Creatinine, Ser 0.81; Hemoglobin 13.4; Platelets 260; Potassium 4.5; Sodium 139; TSH 2.120   Recent Lipid Panel Lab Results  Component Value Date/Time   CHOL 159 10/05/2022 09:36 AM   TRIG 113 10/05/2022 09:36 AM   HDL 42 10/05/2022 09:36 AM   CHOLHDL 3.8 10/05/2022 09:36 AM   LDLCALC 96 10/05/2022 09:36 AM    Wt Readings from Last 3 Encounters:  07/05/23 (!) 318 lb  6.4 oz (144.4 kg)  12/05/22 (!) 314 lb 9.6 oz (142.7 kg)  10/05/22 (!) 315 lb 9.6 oz (143.2 kg)     Objective:    Vital Signs:  BP 116/80 (BP Location: Left Arm, Patient Position: Sitting, Cuff Size: Large)   Pulse 66   Ht 5\' 1"  (1.549 m)   Wt (!) 318 lb 6.4 oz (144.4 kg)   SpO2 96%   BMI 60.16 kg/m    GEN: Well nourished, well developed in no acute distress HEENT: Normal NECK: No JVD; No carotid bruits LYMPHATICS: No lymphadenopathy CARDIAC:RRR, no murmurs, rubs, gallops RESPIRATORY:  Clear to auscultation without rales, wheezing or rhonchi  ABDOMEN: Soft, non-tender, non-distended MUSCULOSKELETAL:  No edema; No deformity  SKIN: Warm and dry NEUROLOGIC:  Alert and oriented x 3 PSYCHIATRIC:  Normal affect  ASSESSMENT & PLAN:    OSA  - The patient is tolerating PAP therapy well without any problems. The PAP download performed by his DME was personally reviewed and interpreted by me today and showed an AHI of 0.9 /hr on 10 cm H2O with 100 % compliance in using more than 4 hours nightly.  The patient has been using and benefiting from PAP use and will continue to benefit from therapy.  -I will order PAP supplies and get her in with a DME  PVCs -she occasionally has some palpitations but well controlled on BB -continue prescription drug management with Lopressor 25mg  BID  Morbid Obesity -this is playing a large role in her OSA and likely her PVCs as well -She has tried to get Wegovy from her PCP but her out of pocket cost is $1200.  I will talk with our PharmD about options she has to get on weight loss meds.   Medication Adjustments/Labs and Tests Ordered: Current medicines are reviewed at length with the patient today.  Concerns regarding medicines are outlined above.  Tests Ordered: No orders of the defined types were placed in this encounter.  Medication Changes: No orders of the defined types were placed in this encounter.   Disposition:  Follow up in 1 year(s)  Signed, Armanda Magic, MD  07/05/2023 1:11 PM    [facility]

## 2023-07-05 NOTE — Patient Instructions (Signed)
Medication Instructions:  Your physician recommends that you continue on your current medications as directed. Please refer to the Current Medication list given to you today.  *If you need a refill on your cardiac medications before your next appointment, please call your pharmacy*   Lab Work: None.  If you have labs (blood work) drawn today and your tests are completely normal, you will receive your results only by: MyChart Message (if you have MyChart) OR A paper copy in the mail If you have any lab test that is abnormal or we need to change your treatment, we will call you to review the results.   Testing/Procedures: None.   Follow-Up:  Your next appointment:   1 year(s)  Provider:   Dr. Armanda Magic, MD    Other Instructions Dr. Mayford Knife has ordered a new cpap mask and supplies for you.

## 2023-07-17 ENCOUNTER — Encounter: Payer: Self-pay | Admitting: Cardiology

## 2023-07-18 ENCOUNTER — Telehealth: Payer: Self-pay | Admitting: *Deleted

## 2023-07-18 DIAGNOSIS — G4733 Obstructive sleep apnea (adult) (pediatric): Secondary | ICD-10-CM

## 2023-07-18 NOTE — Telephone Encounter (Signed)
Per Dr Mayford Knife, -I will order PAP supplies and get her in with a DME    Order placed to [facility] via community message

## 2023-07-27 ENCOUNTER — Other Ambulatory Visit: Payer: Self-pay | Admitting: Family

## 2023-07-27 ENCOUNTER — Encounter: Payer: Self-pay | Admitting: Family

## 2023-07-27 DIAGNOSIS — E559 Vitamin D deficiency, unspecified: Secondary | ICD-10-CM

## 2023-07-27 DIAGNOSIS — J45909 Unspecified asthma, uncomplicated: Secondary | ICD-10-CM

## 2023-07-27 NOTE — Telephone Encounter (Signed)
Left message for pt to schedule appt to get meds refilled/ will mail letter

## 2023-07-27 NOTE — Telephone Encounter (Signed)
[facility] pt NTBS 30-d given 06/25/23

## 2023-09-11 ENCOUNTER — Other Ambulatory Visit: Payer: Self-pay | Admitting: Family

## 2023-09-11 DIAGNOSIS — J45909 Unspecified asthma, uncomplicated: Secondary | ICD-10-CM

## 2023-10-09 ENCOUNTER — Other Ambulatory Visit: Payer: Commercial Managed Care - PPO

## 2023-10-09 ENCOUNTER — Encounter: Payer: Commercial Managed Care - PPO | Admitting: Family

## 2023-10-24 ENCOUNTER — Other Ambulatory Visit: Payer: Self-pay | Admitting: Family

## 2023-10-24 DIAGNOSIS — J45909 Unspecified asthma, uncomplicated: Secondary | ICD-10-CM

## 2023-10-26 ENCOUNTER — Encounter: Payer: Self-pay | Admitting: Internal Medicine

## 2023-10-29 ENCOUNTER — Encounter: Payer: Self-pay | Admitting: Family

## 2023-10-29 ENCOUNTER — Ambulatory Visit: Payer: Commercial Managed Care - PPO | Admitting: Family

## 2023-10-29 VITALS — BP 102/70 | HR 70 | Temp 96.9°F | Ht 61.0 in | Wt 318.8 lb

## 2023-10-29 DIAGNOSIS — E559 Vitamin D deficiency, unspecified: Secondary | ICD-10-CM

## 2023-10-29 DIAGNOSIS — Z0001 Encounter for general adult medical examination with abnormal findings: Secondary | ICD-10-CM

## 2023-10-29 DIAGNOSIS — Z0000 Encounter for general adult medical examination without abnormal findings: Secondary | ICD-10-CM

## 2023-10-29 DIAGNOSIS — I493 Ventricular premature depolarization: Secondary | ICD-10-CM

## 2023-10-29 DIAGNOSIS — L409 Psoriasis, unspecified: Secondary | ICD-10-CM

## 2023-10-29 DIAGNOSIS — Z1211 Encounter for screening for malignant neoplasm of colon: Secondary | ICD-10-CM

## 2023-10-29 DIAGNOSIS — J45909 Unspecified asthma, uncomplicated: Secondary | ICD-10-CM | POA: Diagnosis not present

## 2023-10-29 DIAGNOSIS — K2100 Gastro-esophageal reflux disease with esophagitis, without bleeding: Secondary | ICD-10-CM

## 2023-10-29 DIAGNOSIS — G4733 Obstructive sleep apnea (adult) (pediatric): Secondary | ICD-10-CM

## 2023-10-29 LAB — LIPID PANEL

## 2023-10-29 MED ORDER — PANTOPRAZOLE SODIUM 40 MG PO TBEC: 40.0000 mg | DELAYED_RELEASE_TABLET | Freq: Every day | ORAL | 3 refills | Status: DC

## 2023-10-29 MED ORDER — METOPROLOL SUCCINATE ER 25 MG PO TB24: 25.0000 mg | ORAL_TABLET | Freq: Every day | ORAL | 3 refills | Status: DC

## 2023-10-29 MED ORDER — CALCIPOTRIENE 0.005 % EX OINT: TOPICAL_OINTMENT | Freq: Two times a day (BID) | CUTANEOUS | 0 refills | Status: DC

## 2023-10-29 MED ORDER — BETAMETHASONE DIPROPIONATE 0.05 % EX CREA: TOPICAL_CREAM | Freq: Two times a day (BID) | CUTANEOUS | 0 refills | Status: DC

## 2023-10-29 NOTE — Progress Notes (Signed)
Subjective:    Patient ID: Barbara Hinton, female    DOB: 10/05/1971, 52 y.o.   MRN: [id number]  Chief Complaint  Patient presents with   Annual Exam   Pt presents to the office today for CPE without pap. She is morbid obese with a BMI of 60.     She is followed by Cardiologists for palpitations and PVC. Taking metoprolol.    She has OSA and uses CPAP.  Gastroesophageal Reflux She complains of belching, heartburn and wheezing. She reports no coughing. This is a chronic problem. The current episode started more than 1 year ago. The problem occurs occasionally. Risk factors include obesity. She has tried a PPI for the symptoms. The treatment provided moderate relief.  Asthma She complains of wheezing. There is no cough or shortness of breath. This is a chronic problem. The current episode started more than 1 year ago. The problem occurs intermittently. Associated symptoms include heartburn. Her symptoms are aggravated by exercise. Her symptoms are alleviated by rest. Her past medical history is significant for asthma.  Rash This is a chronic problem. The current episode started more than 1 year ago. The problem has been waxing and waning since onset. Location: right lower leg. The rash is characterized by itchiness and dryness. She was exposed to nothing. Pertinent negatives include no cough or shortness of breath. Past treatments include anti-itch cream, antihistamine and oral steroids. The treatment provided mild relief. Her past medical history is significant for asthma.      Review of Systems  Respiratory:  Positive for wheezing. Negative for cough and shortness of breath.   Gastrointestinal:  Positive for heartburn.  Skin:  Positive for rash.  All other systems reviewed and are negative.   Social History   Socioeconomic History   Marital status: Married    Spouse name: Not on file   Number of children: Not on file   Years of education: Not on file   Highest education level:  Not on file  Occupational History   Not on file  Tobacco Use   Smoking status: Never    Passive exposure: Yes   Smokeless tobacco: Never  Substance and Sexual Activity   Alcohol use: No   Drug use: No   Sexual activity: Not on file  Other Topics Concern   Not on file  Social History Narrative   Not on file   Social Drivers of Health   Financial Resource Strain: Not on file  Food Insecurity: Not on file  Transportation Needs: Not on file  Physical Activity: Not on file  Stress: Not on file  Social Connections: Not on file   Family History  Problem Relation Age of Onset   Diabetes Mother    Diabetes Father    Hypertension Father    Cirrhosis Brother        liver transplant   Hypertension Brother    Heart attack Maternal Grandfather    Heart attack Paternal Grandfather    Colon cancer Neg Hx    Colon polyps Neg Hx         Objective:   Physical Exam Vitals reviewed.  Constitutional:      General: She is not in acute distress.    Appearance: She is well-developed. She is obese.  HENT:     Head: Normocephalic and atraumatic.     Right Ear: Tympanic membrane normal.     Left Ear: Tympanic membrane normal.  Eyes:     Pupils: Pupils are  equal, round, and reactive to light.  Neck:     Thyroid: No thyromegaly.  Cardiovascular:     Rate and Rhythm: Normal rate and regular rhythm.     Heart sounds: Normal heart sounds. No murmur heard. Pulmonary:     Effort: Pulmonary effort is normal. No respiratory distress.     Breath sounds: No wheezing.  Abdominal:     General: Bowel sounds are normal. There is no distension.     Palpations: Abdomen is soft.     Tenderness: There is no abdominal tenderness.  Musculoskeletal:        General: No tenderness. Normal range of motion.     Cervical back: Normal range of motion and neck supple.  Skin:    General: Skin is warm and dry.          Comments: Dry silver rash on right lower leg that is approx 9X10 cm  Neurological:      Mental Status: She is alert and oriented to person, place, and time.     Cranial Nerves: No cranial nerve deficit.     Deep Tendon Reflexes: Reflexes are normal and symmetric.  Psychiatric:        Behavior: Behavior normal.        Thought Content: Thought content normal.        Judgment: Judgment normal.       BP 102/70   Pulse 70   Temp (!) 96.9 F (36.1 C)   Ht 5\' 1"  (1.549 m)   Wt (!) 318 lb 12.8 oz (144.6 kg)   SpO2 97%   BMI 60.24 kg/m      Assessment & Plan:  Barbara Hinton comes in today with chief complaint of Annual Exam   Diagnosis and orders addressed:  1. Annual physical exam (Primary) - CMP14+EGFR - CBC with Differential/Platelet - Lipid panel  2. Mild asthma without complication, unspecified whether persistent - CMP14+EGFR  3. Vitamin D deficiency - CMP14+EGFR - VITAMIN D 25 Hydroxy (Vit-D Deficiency, Fractures)  4. Obesity, morbid (HCC) - CMP14+EGFR  5. OSA (obstructive sleep apnea) - CMP14+EGFR  6. Gastroesophageal reflux disease with esophagitis without hemorrhage - pantoprazole (PROTONIX) 40 MG tablet; Take 1 tablet (40 mg total) by mouth daily. 30 minutes before breakfast  Dispense: 90 tablet; Refill: 3 - CMP14+EGFR  7. Psoriasis - calcipotriene (DOVONOX) 0.005 % ointment; Apply topically 2 (two) times daily.  Dispense: 60 g; Refill: 0 - betamethasone dipropionate 0.05 % cream; Apply topically 2 (two) times daily.  Dispense: 30 g; Refill: 0 - CMP14+EGFR  8. Colon cancer screening - Cologuard - CMP14+EGFR  9. PVC (premature ventricular contraction) - metoprolol succinate (TOPROL-XL) 25 MG 24 hr tablet; Take 1 tablet (25 mg total) by mouth daily.  Dispense: 90 tablet; Refill: 3   Labs pending Will start betamethasone and calcipotriene  for psoriasis. Avoid scratching.  Will change metoprolol to XL to BID  Avoid caffeine  Continue current medications  Keep follow up with specialists  Health Maintenance reviewed Diet and  exercise encouraged  Return in about 6 months (around 04/28/2024), or if symptoms worsen or fail to improve, for chronic follow up.    Jannifer Rodney, FNP

## 2023-10-29 NOTE — Patient Instructions (Signed)
 Psoriasis Psoriasis is a long-term (chronic) skin condition that causes raised, red patches (plaques) to form on the skin. Plaques may show up anywhere on your body. They can be any size or shape. Symptoms of this condition range from mild to very severe. Psoriasis cannot be passed from one person to another (is not contagious). What are the causes? The exact cause of this condition is not known. Psoriasis is an autoimmune disease. With this type of disease, the body's defense system (immune system) mistakenly attacks healthy skin. As a result, too many skin cells form too quickly and create plaques. The disease may start due to a trigger, such as: Damage or trauma to the skin, such as cuts, scrapes, sunburn, and dryness. Not enough exposure to sunlight. Certain medicines. Alcohol or tobacco use. Stress. Infections. What increases the risk? You are more likely to develop this condition if you: Have a family history of psoriasis. Are obese. Are 52-68 years old. Are taking certain medicines. What are the signs or symptoms?  There are different types of psoriasis. You can have more than one type of psoriasis during your life. Sometimes, symptoms get worse for a period of time. These are called flares. Each type of psoriasis has different symptoms. Plaque psoriasis is the most common type. Symptoms include red, raised plaques with a silvery-white coating (scale) that are usually on the scalp, elbows, knees, and back. These plaques may be itchy. Your nails may be pitted and crumbly or fall off. Guttate psoriasis symptoms include small red spots that often show up on your trunk, arms, and legs. These spots may develop after you have been sick, especially with strep throat. Inverse psoriasis symptoms include plaques in your underarm area, under your breasts, or on your genitals, groin, or buttocks. Infection, friction, and heat may cause this type of psoriasis. Pustular psoriasis symptoms include  pus-filled bumps that are painful, red, and swollen on the palms of your hands or the soles of your feet. They can affect mobility. You also may feel fatigued, feverish, weak, or have no appetite. Erythrodermic psoriasis symptoms include bright red skin that may look burned. You may have a fast heartbeat and a body temperature that is too high or too low. You may be itchy or in pain. Sebopsoriasis symptoms include red plaques that have a greasy coating and are often on your scalp, forehead, and face. Psoriatic arthritis causes swollen, painful joints along with scaly skin plaques. Your nails may be pitted and crumbly or fall off. How is this diagnosed? This condition is diagnosed based on your symptoms, family history, and physical exam. Your health care provider may remove a tissue sample (biopsy) for testing. You may also be referred to a health care provider who specializes in skin diseases (dermatologist). How is this treated? There is no cure for this condition, but treatment can help manage it. Goals of treatment include: Helping your skin heal. Reducing itching and inflammation. Slowing the growth of new skin cells. Treating any related conditions. Psoriasis can add to your risk of developing other conditions, such as heart disease, high blood pressure, eye problems, and depression. Treatment varies depending on the severity of your condition. This condition may be treated with: Creams or ointments to help with symptoms. Ultraviolet ray exposure (light therapy or phototherapy). This may include natural sunlight or light therapy in a medical office. Systemic therapy medicines. These medicines can help your body better manage skin cell turnover and inflammation. Medicines may be given as pills or injections.  Biologic medicines. These medicines are usually given as injections or through an IV. These are helpful for many people with severe psoriasis, but there is an increased risk of  infection. Follow these instructions at home: Skin care Moisturize your skin as needed. Only use moisturizers that your health care provider says are okay. Apply cool, wet cloths (cold compresses) to the affected areas. This helps with itching. Do not use a hot tub or take hot showers. Take lukewarm showers and baths. Do not scratch your skin. Lifestyle Maintain a healthy weight. Eat a healthy diet that includes plenty of vegetables, fruits, whole grains, low-fat dairy products, and lean protein. Do not eat a lot of foods that are high in solid fats, added sugars, or salt. Use techniques for stress reduction, such as meditation or yoga. Do not use any products that contain nicotine or tobacco. These products include cigarettes, chewing tobacco, and vaping devices, such as e-cigarettes. If you need help quitting, ask your health care provider. Get safe exposure to the sun as told by your health care provider. This may include spending time outdoors in sunlight. Do not get sunburned. Consider joining a psoriasis support group. General instructions  Take or use over-the-counter and prescription medicines only as told by your health care provider. Keep a journal to help track what triggers an outbreak. Try to avoid any triggers. Do not drink alcohol if your health care provider tells you not to drink. See a mental health therapist if you feel sad, anxious, frustrated, and hopeless. Managing this condition can be challenging and you may feel overwhelmed and depressed. Keep all follow-up visits. Your health care provider may monitor your condition over time to make sure that it does not cause problems or get worse. Where to find support National Psoriasis Foundation: psoriasis.org Where to find more information American Academy of Dermatology: InfoExam.si Contact a health care provider if: You have a fever or chills. Your signs or symptoms gets worse. You have more redness or warmth in the  affected areas. You have new or worsening pain or stiffness in your joints. Your nails break easily or pull away from the nail bed. You feel depressed, frustrated, or hopeless about your condition. Get help right away if: You have thoughts of hurting yourself or others. Get help right away if you feel like you may hurt yourself or others, or have thoughts about taking your own life. Go to your nearest emergency room or: Call 911. Call the National Suicide Prevention Lifeline at 9065190731 or 988. This is open 24 hours a day. Text the Crisis Text Line at 380-378-4752. Summary Psoriasis is a long-term (chronic) skin condition that causes raised, red patches (plaques) to form on the skin. There is no cure for this condition, but treatment can help manage it. Treatment varies depending on the severity of your condition. Keep a journal to track what triggers an outbreak. Try to avoid any triggers. This information is not intended to replace advice given to you by your health care provider. Make sure you discuss any questions you have with your health care provider. Document Revised: 12/28/2021 Document Reviewed: 12/28/2021 Elsevier Patient Education  2024 [employer].

## 2023-10-30 LAB — CBC WITH DIFFERENTIAL/PLATELET
Basophils Absolute: 0 10*3/uL (ref 0.0–0.2)
Basos: 1 %
EOS (ABSOLUTE): 0.2 10*3/uL (ref 0.0–0.4)
Eos: 2 %
Hematocrit: 44.5 % (ref 34.0–46.6)
Hemoglobin: 14.6 g/dL (ref 11.1–15.9)
Immature Grans (Abs): 0 10*3/uL (ref 0.0–0.1)
Immature Granulocytes: 0 %
Lymphocytes Absolute: 2.3 10*3/uL (ref 0.7–3.1)
Lymphs: 32 %
MCH: 29.1 pg (ref 26.6–33.0)
MCHC: 32.8 g/dL (ref 31.5–35.7)
MCV: 89 fL (ref 79–97)
Monocytes Absolute: 0.5 10*3/uL (ref 0.1–0.9)
Monocytes: 7 %
Neutrophils Absolute: 4.1 10*3/uL (ref 1.4–7.0)
Neutrophils: 58 %
Platelets: 287 10*3/uL (ref 150–450)
RBC: 5.02 x10E6/uL (ref 3.77–5.28)
RDW: 12.7 % (ref 11.7–15.4)
WBC: 7.1 10*3/uL (ref 3.4–10.8)

## 2023-10-30 LAB — CMP14+EGFR
ALT: 28 IU/L (ref 0–32)
AST: 27 IU/L (ref 0–40)
Albumin: 4.1 g/dL (ref 3.9–4.9)
Alkaline Phosphatase: 99 IU/L (ref 44–121)
BUN/Creatinine Ratio: 12 (ref 9–23)
BUN: 12 mg/dL (ref 6–24)
Bilirubin Total: 0.6 mg/dL (ref 0.0–1.2)
CO2: 23 mmol/L (ref 20–29)
Calcium: 9.6 mg/dL (ref 8.7–10.2)
Chloride: 102 mmol/L (ref 96–106)
Creatinine, Ser: 0.97 mg/dL (ref 0.57–1.00)
Globulin, Total: 2.6 g/dL (ref 1.5–4.5)
Glucose: 117 mg/dL — ABNORMAL HIGH (ref 70–99)
Potassium: 4.4 mmol/L (ref 3.5–5.2)
Sodium: 140 mmol/L (ref 134–144)
Total Protein: 6.7 g/dL (ref 6.0–8.5)
eGFR: 71 mL/min/{1.73_m2} (ref >59)

## 2023-10-30 LAB — LIPID PANEL
Cholesterol, Total: 169 mg/dL (ref 100–199)
HDL: 47 mg/dL (ref >39)
LDL CALC COMMENT:: 3.6 ratio (ref 0.0–4.4)
LDL Chol Calc (NIH): 101 mg/dL — ABNORMAL HIGH (ref 0–99)
Triglycerides: 119 mg/dL (ref 0–149)
VLDL Cholesterol Cal: 21 mg/dL (ref 5–40)

## 2023-10-30 LAB — VITAMIN D 25 HYDROXY (VIT D DEFICIENCY, FRACTURES): Vit D, 25-Hydroxy: 41.3 ng/mL (ref 30.0–100.0)

## 2023-11-06 ENCOUNTER — Ambulatory Visit: Payer: Commercial Managed Care - PPO | Admitting: Family

## 2023-11-22 ENCOUNTER — Other Ambulatory Visit: Payer: Self-pay | Admitting: Family

## 2023-11-22 DIAGNOSIS — J45909 Unspecified asthma, uncomplicated: Secondary | ICD-10-CM

## 2023-11-28 NOTE — Telephone Encounter (Signed)
Patient called today stating she never got her supplies from last year. She did talk to someone from [place] last year who told her she could get her supplies but it would cost $77 dollars a month but she never got a bill or supplies. I reached out to [facility] and talked to the PAP department and they have the order but do not know why she never got her supplies. Christoper Allegra said have the patient call the cpap department and they will give her the supplies. I let the patient know to call [facility] at [phone].

## 2023-12-07 ENCOUNTER — Encounter: Payer: Self-pay | Admitting: Family

## 2023-12-07 ENCOUNTER — Telehealth (INDEPENDENT_AMBULATORY_CARE_PROVIDER_SITE_OTHER): Payer: Commercial Managed Care - PPO | Admitting: Family

## 2023-12-07 DIAGNOSIS — U071 COVID-19: Secondary | ICD-10-CM

## 2023-12-07 DIAGNOSIS — Z20822 Contact with and (suspected) exposure to covid-19: Secondary | ICD-10-CM | POA: Diagnosis not present

## 2023-12-07 MED ORDER — BENZONATATE 200 MG PO CAPS: 200.0000 mg | ORAL_CAPSULE | Freq: Three times a day (TID) | ORAL | 1 refills | Status: DC | PRN

## 2023-12-07 MED ORDER — MOLNUPIRAVIR EUA 200MG CAPSULE: 4.0000 | ORAL_CAPSULE | Freq: Two times a day (BID) | ORAL | 0 refills | Status: AC

## 2023-12-07 NOTE — Telephone Encounter (Signed)
Copied from CRM [id number]. Topic: Clinical - Prescription Issue >> Dec 07, 2023  3:40 PM Antony Haste wrote: Reason for CRM: PT was advised to contact the office after her VV with Neysa Bonito for her medication for COVID, she states she called her pharmacy and was advised with her insurance it will be $668, she is wanting an alternative sent to the pharmacy.

## 2023-12-07 NOTE — Progress Notes (Signed)
Virtual Visit Consent   Barbara Hinton, you are scheduled for a virtual visit with a [place] provider today. Just as with appointments in the office, your consent must be obtained to participate. Your consent will be active for this visit and any virtual visit you may have with one of our providers in the next 365 days. If you have a MyChart account, a copy of this consent can be sent to you electronically.  As this is a virtual visit, video technology does not allow for your provider to perform a traditional examination. This may limit your provider's ability to fully assess your condition. If your provider identifies any concerns that need to be evaluated in person or the need to arrange testing (such as labs, EKG, etc.), we will make arrangements to do so. Although advances in technology are sophisticated, we cannot ensure that it will always work on either your end or our end. If the connection with a video visit is poor, the visit may have to be switched to a telephone visit. With either a video or telephone visit, we are not always able to ensure that we have a secure connection.  By engaging in this virtual visit, you consent to the provision of healthcare and authorize for your insurance to be billed (if applicable) for the services provided during this visit. Depending on your insurance coverage, you may receive a charge related to this service.  I need to obtain your verbal consent now. Are you willing to proceed with your visit today? Barbara Hinton has provided verbal consent on 12/07/2023 for a virtual visit (video or telephone). Barbara Rodney, FNP  Date: 12/07/2023 3:23 PM  Virtual Visit via Video Note   I, Barbara Rodney, connected with  Barbara Hinton  ([id number], [birth year]) on 12/07/23 at  3:25 PM EST by a video-enabled telemedicine application and verified that I am speaking with the correct person using two identifiers.  Location: Patient: Virtual Visit Location Patient:  Home Provider: Virtual Visit Location Provider: Home Office   I discussed the limitations of evaluation and management by telemedicine and the availability of in person appointments. The patient expressed understanding and agreed to proceed.    History of Present Illness: Barbara Hinton is a 53 y.o. who identifies as a female who was assigned female at birth, and is being seen today for COVID. Her husband was diagnosed with COVID on Monday and she started having symptoms two days ago.   HPI: URI  This is a new problem. The current episode started in the past 7 days. The problem has been gradually worsening. Associated symptoms include congestion, coughing, headaches, joint pain, rhinorrhea, sinus pain, sneezing and a sore throat. Pertinent negatives include no ear pain, nausea or vomiting. She has tried acetaminophen for the symptoms. The treatment provided mild relief.    Problems:  Patient Active Problem List   Diagnosis Date Noted   PVC (premature ventricular contraction)    Diastasis recti 06/03/2021   Gastroesophageal reflux disease with esophagitis without hemorrhage 06/03/2021   OSA (obstructive sleep apnea) 12/04/2017   Cervical somatic dysfunction 09/14/2017   Obesity, morbid (HCC) 11/28/2016   Asthma 07/17/2016   Encounter for birth control 07/17/2016   Vitamin D deficiency 07/17/2016   Eczematous dermatitis 07/17/2016    Allergies: No Known Allergies Medications:  Current Outpatient Medications:    molnupiravir EUA (LAGEVRIO) 200 mg CAPS capsule, Take 4 capsules (800 mg total) by mouth 2 (two) times daily for 5 days., Disp:  40 capsule, Rfl: 0   albuterol (VENTOLIN HFA) 108 (90 Base) MCG/ACT inhaler, INHALE 2 PUFFS INTO THE LUNGS EVERY 6 HOURS AS NEEDED FOR WHEEZE OR SHORTNESS OF BREATH, Disp: 8.5 each, Rfl: 1   aspirin 81 MG tablet, Take 81 mg by mouth 2 (two) times a week., Disp: , Rfl:    betamethasone dipropionate 0.05 % cream, Apply topically 2 (two) times daily., Disp:  30 g, Rfl: 0   calcipotriene (DOVONOX) 0.005 % ointment, Apply topically 2 (two) times daily., Disp: 60 g, Rfl: 0   fluticasone-salmeterol (WIXELA INHUB) 100-50 MCG/ACT AEPB, INHALE 1 PUFF INTO THE LUNGS TWICE A DAY, Disp: 60 each, Rfl: 2   metoprolol succinate (TOPROL-XL) 25 MG 24 hr tablet, Take 1 tablet (25 mg total) by mouth daily., Disp: 90 tablet, Rfl: 3   pantoprazole (PROTONIX) 40 MG tablet, Take 1 tablet (40 mg total) by mouth daily. 30 minutes before breakfast, Disp: 90 tablet, Rfl: 3   Vitamin D, Ergocalciferol, (DRISDOL) 1.25 MG (50000 UNIT) CAPS capsule, TAKE 1 CAPSULE (50,000 UNITS TOTAL) BY MOUTH EVERY 7 (SEVEN) DAYS, Disp: 12 capsule, Rfl: 1  Observations/Objective: Patient is well-developed, well-nourished in no acute distress.  Resting comfortably  at home.  Head is normocephalic, atraumatic.  No labored breathing.  Speech is clear and coherent with logical content.  Patient is alert and oriented at baseline.  Dry nonproductive   Assessment and Plan: 1. Exposure to COVID-19 virus (Primary) - molnupiravir EUA (LAGEVRIO) 200 mg CAPS capsule; Take 4 capsules (800 mg total) by mouth 2 (two) times daily for 5 days.  Dispense: 40 capsule; Refill: 0  2. COVID-19 - molnupiravir EUA (LAGEVRIO) 200 mg CAPS capsule; Take 4 capsules (800 mg total) by mouth 2 (two) times daily for 5 days.  Dispense: 40 capsule; Refill: 0  COVID positive, rest, force fluids, tylenol as needed, report any worsening symptoms such as increased shortness of breath, swelling, or continued high fevers. Possible adverse effects discussed with antivirals.    Follow Up Instructions: I discussed the assessment and treatment plan with the patient. The patient was provided an opportunity to ask questions and all were answered. The patient agreed with the plan and demonstrated an understanding of the instructions.  A copy of instructions were sent to the patient via MyChart unless otherwise noted below.     The  patient was advised to call back or seek an in-person evaluation if the symptoms worsen or if the condition fails to improve as anticipated.    Barbara Rodney, FNP

## 2023-12-10 ENCOUNTER — Other Ambulatory Visit: Payer: Self-pay | Admitting: Family

## 2023-12-10 DIAGNOSIS — Z1231 Encounter for screening mammogram for malignant neoplasm of breast: Secondary | ICD-10-CM

## 2023-12-13 ENCOUNTER — Other Ambulatory Visit: Payer: Self-pay | Admitting: Family

## 2023-12-13 DIAGNOSIS — J45909 Unspecified asthma, uncomplicated: Secondary | ICD-10-CM

## 2023-12-17 ENCOUNTER — Inpatient Hospital Stay: Admission: RE | Admit: 2023-12-17 | Payer: Commercial Managed Care - PPO | Source: Ambulatory Visit

## 2024-02-18 ENCOUNTER — Ambulatory Visit
Admission: RE | Admit: 2024-02-18 | Discharge: 2024-02-18 | Disposition: A | Discharge status: Home / Self Care | Source: Ambulatory Visit | Attending: Family | Admitting: Family

## 2024-02-18 DIAGNOSIS — Z1231 Encounter for screening mammogram for malignant neoplasm of breast: Secondary | ICD-10-CM

## 2024-02-26 ENCOUNTER — Other Ambulatory Visit: Payer: Self-pay | Admitting: Family

## 2024-02-26 DIAGNOSIS — J45909 Unspecified asthma, uncomplicated: Secondary | ICD-10-CM

## 2024-03-02 ENCOUNTER — Other Ambulatory Visit: Payer: Self-pay | Admitting: Family

## 2024-03-02 DIAGNOSIS — J45909 Unspecified asthma, uncomplicated: Secondary | ICD-10-CM

## 2024-03-24 ENCOUNTER — Emergency Department (HOSPITAL_COMMUNITY)
Admission: EM | Admit: 2024-03-24 | Discharge: 2024-03-24 | Disposition: A | Discharge status: Home / Self Care | Attending: Emergency Medicine | Admitting: Emergency Medicine

## 2024-03-24 ENCOUNTER — Encounter (HOSPITAL_COMMUNITY): Payer: Self-pay | Admitting: Emergency Medicine

## 2024-03-24 ENCOUNTER — Other Ambulatory Visit: Payer: Self-pay

## 2024-03-24 ENCOUNTER — Emergency Department (HOSPITAL_COMMUNITY)

## 2024-03-24 ENCOUNTER — Encounter: Payer: Self-pay | Admitting: Cardiology

## 2024-03-24 DIAGNOSIS — J45909 Unspecified asthma, uncomplicated: Secondary | ICD-10-CM | POA: Insufficient documentation

## 2024-03-24 DIAGNOSIS — R079 Chest pain, unspecified: Secondary | ICD-10-CM | POA: Diagnosis present

## 2024-03-24 LAB — CBC
HCT: 44 % (ref 36.0–46.0)
Hemoglobin: 14.1 g/dL (ref 12.0–15.0)
MCH: 28.7 pg (ref 26.0–34.0)
MCHC: 32 g/dL (ref 30.0–36.0)
MCV: 89.6 fL (ref 80.0–100.0)
Platelets: 280 10*3/uL (ref 150–400)
RBC: 4.91 MIL/uL (ref 3.87–5.11)
RDW: 13.5 % (ref 11.5–15.5)
WBC: 7.4 10*3/uL (ref 4.0–10.5)
nRBC: 0 % (ref 0.0–0.2)

## 2024-03-24 LAB — BASIC METABOLIC PANEL WITH GFR
Anion gap: 9 (ref 5–15)
BUN: 10 mg/dL (ref 6–20)
CO2: 24 mmol/L (ref 22–32)
Calcium: 9.4 mg/dL (ref 8.9–10.3)
Chloride: 107 mmol/L (ref 98–111)
Creatinine, Ser: 0.94 mg/dL (ref 0.44–1.00)
GFR, Estimated: >60 mL/min (ref >60)
Glucose, Bld: 104 mg/dL — ABNORMAL HIGH (ref 70–99)
Potassium: 4.3 mmol/L (ref 3.5–5.1)
Sodium: 140 mmol/L (ref 135–145)

## 2024-03-24 LAB — TROPONIN I (HIGH SENSITIVITY)
Troponin I (High Sensitivity): 4 ng/L (ref <18)
Troponin I (High Sensitivity): 3 ng/L (ref <18)

## 2024-03-24 LAB — HCG, SERUM, QUALITATIVE: Preg, Serum: NEGATIVE

## 2024-03-24 NOTE — ED Provider Notes (Signed)
 [place] EMERGENCY DEPARTMENT AT [facility] Provider Note  History  Chief Complaint:  Chest Pain   Chest Pain Pain location:  L chest Pain quality: sharp   Pain radiates to:  L jaw and L arm Pain severity:  Mild Onset quality:  Sudden Timing:  Intermittent Progression:  Waxing and waning Chronicity:  New Context: at rest   Context: not breathing, not eating, not lifting, not movement and not trauma   Relieved by:  Nothing Worsened by:  Nothing Associated symptoms: palpitations   Associated symptoms: no abdominal pain, no altered mental status, no fatigue, no fever, no shortness of breath and no weakness   Risk factors: obesity   Risk factors: no coronary artery disease, no diabetes mellitus, no high cholesterol, no hypertension, no immobilization, no prior DVT/PE and no smoking      Barbara Hinton is a 53 y.o. female with a history of asthma, obesity who presents the emergency department for left-sided chest pain, left-sided jaw pain and pain down the arm yesterday around 09 100.  She reports that the pain was intermittent yesterday morning.  She states that she had a brief lightheaded period at work.  She states that that around noon she felt "normal".  She then felt the similar presentation of chest pain at 07 30 this morning.  She denies any palpitations.  No shortness of breath.  No leg swelling. Has had a headache for the past 2-3 days.  Past Medical History:  Diagnosis Date   Asthma    OSA (obstructive sleep apnea) 12/04/2017   Mild with AHI overall 9.8/hr but during REM sleep severe with AHI 53.8/hr and nocturnal hypoxemia now on CPAP at 10cm H2O.     PVC (premature ventricular contraction)     Past Surgical History:  Procedure Laterality Date   CESAREAN SECTION      Family History  Problem Relation Age of Onset   Diabetes Mother    Diabetes Father    Hypertension Father    Cirrhosis Brother        liver transplant   Hypertension Brother     Heart attack Maternal Grandfather    Heart attack Paternal Grandfather    Colon cancer Neg Hx    Colon polyps Neg Hx     Social History   Tobacco Use   Smoking status: Never    Passive exposure: Yes   Smokeless tobacco: Never  Substance Use Topics   Alcohol use: No   Drug use: No    Review of Systems  Review of Systems  Constitutional:  Negative for fatigue and fever.  Respiratory:  Negative for shortness of breath.   Cardiovascular:  Positive for chest pain and palpitations.  Gastrointestinal:  Negative for abdominal pain.  Neurological:  Negative for weakness.     Reviewed and documented in HPI if pertinent.   Physical Exam   ED Triage Vitals  Encounter Vitals Group     BP 03/24/24 1220 (!) 129/57     Systolic BP Percentile --      Diastolic BP Percentile --      Pulse Rate 03/24/24 1220 81     Resp 03/24/24 1220 17     Temp 03/24/24 1220 98.2 F (36.8 C)     Temp Source 03/24/24 1220 Oral     SpO2 03/24/24 1220 96 %     Weight 03/24/24 1353 (!) 321 lb (145.6 kg)     Height 03/24/24 1353 5\' 1"  (1.549 m)  Head Circumference --      Peak Flow --      Pain Score 03/24/24 1352 0     Pain Loc --      Pain Education --      Exclude from Growth Chart --      Physical Exam Vitals and nursing note reviewed.  Constitutional:      General: She is not in acute distress.    Appearance: She is well-developed.  HENT:     Head: Normocephalic and atraumatic.  Eyes:     Conjunctiva/sclera: Conjunctivae normal.  Cardiovascular:     Rate and Rhythm: Normal rate and regular rhythm.     Heart sounds: No murmur heard. Pulmonary:     Effort: Pulmonary effort is normal. No respiratory distress.     Breath sounds: Normal breath sounds.  Abdominal:     Palpations: Abdomen is soft.     Tenderness: There is no abdominal tenderness.  Musculoskeletal:        General: No swelling.     Cervical back: Neck supple.  Skin:    General: Skin is warm and dry.     Capillary  Refill: Capillary refill takes less than 2 seconds.  Neurological:     Mental Status: She is alert.  Psychiatric:        Mood and Affect: Mood normal.      Procedures   Procedures  ED Course - Medical Decision Making  Brief Overview Barbara Hinton is a 53 y.o. female who presents as per above.  I have reviewed the nursing documentation for past medical history, family history, and social history and agree.  I have reviewed the patient's vital signs. There are no abnormalities.  Initial Differential Diagnoses: I am primarily concerned for ACS, costochondritis, electrolyte abnormalities, pregnancy, pneumonia, pneumothorax.  Therapies: These medications and interventions were provided for the patient while in the ED.  Medications - No data to display  Testing Results: On my interpretation labs are significant for : Initial troponin 4, repeat 3 No significant electrolyte abnormalities  On my interpretation imaging is significant for: Chest x-ray without opacity  EKG Interpretation Date/Time:  Monday Mar 24 2024 13:44:32 EDT Ventricular Rate:  76 PR Interval:  158 QRS Duration:  66 QT Interval:  400 QTC Calculation: 450 R Axis:   57  Text Interpretation: Normal sinus rhythm Low voltage QRS Cannot rule out Anterior infarct , age undetermined Abnormal ECG No previous ECGs available Confirmed by Celesta Coke ([id number]) on 03/24/2024 5:19:31 PM   See the EMR for full details regarding lab and imaging results.   Medical Decision Making 53 year old female who presents emergency department as above.  On initial evaluation patient does have 2+ radial pulses in 2+ PT pulses.  She has stable lower extremity edema.  They are nonpitting.  Her lung exam is unremarkable.  Her lung sounds are unremarkable, no evidence of murmurs.  She does have mild reproducible chest pain.  EKG reveals T wave flattening anteriorly.  No overt signs of ischemia.  Initial troponin is reassuring.   Delta reassuring.  Heart score 4.  Expressed urgent cardiology follow-up.  We considered PE however patient Geneva score is 0.  The story does not appear consistent with PE.  She has no pleuritic chest pain or shortness of breath.  She has no unilateral lower extremity swelling.  She has no risk factors for PE.  No malignancy.  We feel that given her presentation and reassuring labs and EKG we  feel that she can manage this in the outpatient setting.  Patient does have an established cardiologist for prior PVCs.  The triage note documents atrial fibrillation.  However on review of the patient's prior EKG she has no evidence of atrial fibrillation in her system.  She also has a cardiology note in 2024 who states that she has a history of PVCs which is why she is on metoprolol .  Therefore I feel this is an error.  I discussed with the patient regarding urgent cardiology follow-up.  I encouraged her to call the office tomorrow morning to establish a visit this week.  She understood this plan.  Problems Addressed: Chest pain, unspecified type: acute illness or injury that poses a threat to life or bodily functions  Amount and/or Complexity of Data Reviewed Labs: ordered. Radiology: ordered.     ### All radiography studies, electrocardiograms, and laboratory data were personally reviewed by me and incorporated into my medical decision making. Impression   1. Chest pain, unspecified type      Note: Dragon medical dictation software was used in the creation of this note.     Arminda Landmark, MD 03/24/24 2144    Celesta Coke K, DO 03/24/24 2348

## 2024-03-24 NOTE — ED Triage Notes (Signed)
 Pt came in for CP, L. Arm pain x 2 days. Began yesterday morning while getting up for work.  Had jaw pain and L. Arm pain with some dizziness.  Symptoms resolved.  Pt developed HA last night and some more mild CP this AM.  Pt has hx of afib and takes metoprolol .  Pt takes 81 mg ASA bid and took one yesterday.

## 2024-03-24 NOTE — Discharge Instructions (Addendum)
 Barbara Hinton:  Thank you for allowing us  to take care of you today.  We hope you begin feeling better soon. You were seen today for chest pain.  Your laboratory studies reveal no cardiac damage at this time.  We spoke about calling your cardiologist in the morning for an urgent cardiology appointment.  To-Do:  Please follow-up with your primary doctor within the next 2-3 days. It is important that you review any labs or imaging results (if any) that you had today with them. Your preliminary imaging results (if any) are attached. Please return to the Emergency Department or call 911 if you experience chest pain, shortness of breath, severe pain, severe fever, altered mental status, or have any reason to think that you need emergency medical care.  Thank you again.  Hope you feel better soon.  Arminda Landmark, MD Department of Emergency Medicine

## 2024-03-24 NOTE — ED Provider Triage Note (Signed)
 Emergency Medicine Provider Triage Evaluation Note  Barbara Hinton , a 53 y.o. female  was evaluated in triage.  Pt complains of chest pain.  Yesterday had chest pain central/left sided chest pain, left jaw pain and pain down the arm around yesterday 0900. Reports that the pain was intermittent yesterday morning. Reports that she had a lightheaded moment at work.  And then around 1200 she felt at her baseline. She reports that pain was in her chest in the same areas as it was yesterday around 0730. Reports like she felt a "pause" this AM. No SOB. No leg swelling atypical for her. Denies palpation.   Also reports a right sided headache for the past 2-3 days. Denies any vision changes. Denies any trouble walking or talking.   Currently no chest pain.   Review of Systems  Positive:  Negative:   Physical Exam  BP (!) 129/57 (BP Location: Left Arm)   Pulse 81   Temp 98.2 F (36.8 C) (Oral)   Resp 17   Ht 5\' 1"  (1.549 m)   Wt (!) 145.6 kg   SpO2 96%   BMI 60.65 kg/m  Gen:   Awake, no distress   Resp:  Normal effort  MSK:   Moves extremities without difficulty  Other:    Medical Decision Making  Medically screening exam initiated at 2:38 PM.  Appropriate orders placed.  Barbara Hinton was informed that the remainder of the evaluation will be completed by another provider, this initial triage assessment does not replace that evaluation, and the importance of remaining in the ED until their evaluation is complete.  Will order CP workup the patient does not appear in any acute distress.    Spence Dux, PA-C 03/24/24 1446

## 2024-03-25 ENCOUNTER — Encounter: Payer: Self-pay | Admitting: Cardiology

## 2024-04-02 ENCOUNTER — Other Ambulatory Visit: Payer: Self-pay

## 2024-04-02 ENCOUNTER — Other Ambulatory Visit (HOSPITAL_COMMUNITY): Payer: Self-pay

## 2024-04-02 ENCOUNTER — Ambulatory Visit: Attending: Cardiology | Admitting: Cardiology

## 2024-04-02 VITALS — BP 95/65 | HR 77 | Ht 62.5 in | Wt 320.0 lb

## 2024-04-02 DIAGNOSIS — I493 Ventricular premature depolarization: Secondary | ICD-10-CM

## 2024-04-02 DIAGNOSIS — G4733 Obstructive sleep apnea (adult) (pediatric): Secondary | ICD-10-CM

## 2024-04-02 DIAGNOSIS — R079 Chest pain, unspecified: Secondary | ICD-10-CM

## 2024-04-02 MED ORDER — IVABRADINE HCL 7.5 MG PO TABS
15.0000 mg | ORAL_TABLET | Freq: Once | ORAL | 0 refills | Status: AC
  Filled 2024-04-02: qty 2, 1d supply, fill #0

## 2024-04-02 MED ORDER — METOPROLOL SUCCINATE ER 25 MG PO TB24: 12.5000 mg | ORAL_TABLET | Freq: Every day | ORAL | 3 refills | Status: DC

## 2024-04-02 NOTE — Addendum Note (Signed)
 Addended by: Tyri Elmore C on: 04/02/2024 02:30 PM   Modules accepted: Orders

## 2024-04-02 NOTE — Addendum Note (Signed)
 Addended by: Delroy Fields on: 04/02/2024 02:03 PM   Modules accepted: Orders

## 2024-04-02 NOTE — Progress Notes (Signed)
 Date:  04/02/2024   ID:  Ryana, [place] Jul 29, 1971, MRN [id number]  PCP:  Yevette Hem, FNP  Sleep Medicine:  Gaylyn Keas, MD Electrophysiologist:  None   Chief Complaint:  OSA  History of Present Illness:    ANTOINE FIALLOS is a 53 y.o. female with a hx of obesity, snoring, PVCs and excessive daytime sleepiness with an ESS of 16 who was referred by Dr. Felipe Horton for sleep study.  Her PSG showed mild OSA overall with an AHI of 9.8/hr but severe during REM sleep with an AHI of 53.8/hr and nocturnal hypoxemia with O2 desats as low as 81%.  She underwent CPAP titration to 10cm H2O.    SHe is doing well with her PAP device and thinks that she has gotten used to it.  She tolerates the full face mask and feels the pressure is adequate.  Since going on PAP she feels rested in the am and has no significant daytime sleepiness.  She denies any significant mouth or nasal dryness or nasal congestion.  She still snores based on her husband  Her PVCs are very well controlled. She has been having dizziness and was in the ER last week with left sided chest pain sharp in quality radiating into her left arm and tingling in her jaw.  It was sudden in occurrence.  She went to work and then had some dizziness.  Her symptoms started on Sunday and did not seek medical attention because she had to work.  She had no further CP after she got to work.  The next day she got up and had more CP and decided to go to the ER.  hsTrop was normal at 3 and 4.  She has not had any further CP but has had some burning in her chest.  She had no SOB, nausea or diaphoresis. She also has been having headaches. EKG in ER showed NSR with no acute ST changes and poor R wave progression in the precordial leads.   Prior CV studies:   The following studies were reviewed today:  PAP compliance download  Past Medical History:  Diagnosis Date   Asthma    OSA (obstructive sleep apnea) 12/04/2017   Mild with AHI overall 9.8/hr but  during REM sleep severe with AHI 53.8/hr and nocturnal hypoxemia now on CPAP at 10cm H2O.     PVC (premature ventricular contraction)    Past Surgical History:  Procedure Laterality Date   CESAREAN SECTION       Current Meds  Medication Sig   albuterol  (VENTOLIN  HFA) 108 (90 Base) MCG/ACT inhaler INHALE 2 PUFFS INTO THE LUNGS EVERY 6 HOURS AS NEEDED FOR WHEEZE OR SHORTNESS OF BREATH   aspirin 81 MG tablet Take 81 mg by mouth 2 (two) times a week.   benzonatate  (TESSALON ) 200 MG capsule Take 1 capsule (200 mg total) by mouth 3 (three) times daily as needed.   betamethasone  dipropionate 0.05 % cream Apply topically 2 (two) times daily.   calcipotriene  (DOVONOX) 0.005 % ointment Apply topically 2 (two) times daily.   fluticasone -salmeterol (WIXELA INHUB) 100-50 MCG/ACT AEPB INHALE 1 PUFF INTO THE LUNGS TWICE A DAY   metoprolol  succinate (TOPROL -XL) 25 MG 24 hr tablet Take 1 tablet (25 mg total) by mouth daily.   pantoprazole  (PROTONIX ) 40 MG tablet Take 1 tablet (40 mg total) by mouth daily. 30 minutes before breakfast     Allergies:   Patient has no known allergies.   Social History  Tobacco Use   Smoking status: Never    Passive exposure: Yes   Smokeless tobacco: Never  Substance Use Topics   Alcohol use: No   Drug use: No     Family Hx: The patient's family history includes Cirrhosis in her brother; Diabetes in her father and mother; Heart attack in her maternal grandfather and paternal grandfather; Hypertension in her brother and father. There is no history of Colon cancer or Colon polyps.  ROS:   Please see the history of present illness.     All other systems reviewed and are negative.   Labs/Other Tests and Data Reviewed:    Recent Labs: 10/29/2023: ALT 28 03/24/2024: BUN 10; Creatinine, Ser 0.94; Hemoglobin 14.1; Platelets 280; Potassium 4.3; Sodium 140   Recent Lipid Panel Lab Results  Component Value Date/Time   CHOL 169 10/29/2023 08:57 AM   TRIG 119  10/29/2023 08:57 AM   HDL 47 10/29/2023 08:57 AM   CHOLHDL 3.6 10/29/2023 08:57 AM   LDLCALC 101 (H) 10/29/2023 08:57 AM    Wt Readings from Last 3 Encounters:  04/02/24 (!) 320 lb (145.2 kg)  03/24/24 (!) 321 lb (145.6 kg)  10/29/23 (!) 318 lb 12.8 oz (144.6 kg)     Objective:    Vital Signs:  BP 95/65 (BP Location: Left Arm)   Pulse 77   Ht 5' 2.5" (1.588 m)   Wt (!) 320 lb (145.2 kg)   SpO2 94%   BMI 57.60 kg/m    GEN: Well nourished, well developed in no acute distress HEENT: Normal NECK: No JVD; No carotid bruits LYMPHATICS: No lymphadenopathy CARDIAC:RRR, no murmurs, rubs, gallops RESPIRATORY:  Clear to auscultation without rales, wheezing or rhonchi  ABDOMEN: Soft, non-tender, non-distended MUSCULOSKELETAL:  No edema; No deformity  SKIN: Warm and dry NEUROLOGIC:  Alert and oriented x 3 PSYCHIATRIC:  Normal affect  ASSESSMENT & PLAN:    OSA - The patient is tolerating PAP therapy well without any problems. The PAP download performed by his DME was personally reviewed and interpreted by me today and showed an AHI of 1.2 /hr on 10 cm H2O with 100 % compliance in using more than 4 hours nightly.  The patient has been using and benefiting from PAP use and will continue to benefit from therapy.   PVCs -palpitations are rare  -she had been on Toprol  25mg  daily but her BP has been soft and she has had dizziness -decrease Toprol  XL to 12.5mg  daily  Chest pain -atypical and typical features>>radiation to left arm and jaw but no SOB, nausea or diaphoresis. -hs Trop normal x 4 and EKG was nonischemic -I will get a coronary CTA to define coronary anatomy -check FLP and ALT today   Medication Adjustments/Labs and Tests Ordered: Current medicines are reviewed at length with the patient today.  Concerns regarding medicines are outlined above.  Tests Ordered: No orders of the defined types were placed in this encounter.  Medication Changes: No orders of the defined types  were placed in this encounter.   Disposition:  Follow up in 1 year(s)  Signed, Gaylyn Keas, MD  04/02/2024 1:44 PM    [facility]

## 2024-04-02 NOTE — Patient Instructions (Addendum)
 Medication Instructions:  Decrease metoprolol  succinate to 12.5 mg *If you need a refill on your cardiac medications before your next appointment, please call your pharmacy*  Lab Work: Lipid and CMET today  If you have labs (blood work) drawn today and your tests are completely normal, you will receive your results only by: MyChart Message (if you have MyChart) OR A paper copy in the mail If you have any lab test that is abnormal or we need to change your treatment, we will call you to review the results.  Testing/Procedures: Your physician has requested that you have cardiac CT. Cardiac computed tomography (CT) is a painless test that uses an x-ray machine to take clear, detailed pictures of your heart. For further information please visit [email or web address] Please follow instruction sheet as given.     Your cardiac CT will be scheduled at one of the below locations:   [facility] [address] [place], [place] [address] [phone]  OR   [facility] and Vascular Tower [address]  [place], [place] [address] Opening March 03, 2024  If scheduled at [facility], please arrive at the [facility] and Children's Entrance (Entrance C2) of [facility] 30 minutes prior to test start time. You can use the FREE valet parking offered at entrance C (encouraged to control the heart rate for the test)  Proceed to the [facility] Radiology Department (first floor) to check-in and test prep.   All radiology patients and guests should use entrance C2 at [facility], accessed from [facility], even though the hospital's physical address listed is [address].    If scheduled at the Heart and Vascular Tower at [address], please enter the parking lot using the [address] entrance and use the FREE valet service at the patient drop-off area. Enter the buidling and check-in with registration on the main  floor.   Please follow these instructions carefully (unless otherwise directed):  An IV will be required for this test and Nitroglycerin will be given.   On the Night Before the Test: Be sure to Drink plenty of water. Do not consume any caffeinated/decaffeinated beverages or chocolate 12 hours prior to your test. Do not take any antihistamines 12 hours prior to your test.  On the Day of the Test: Drink plenty of water until 1 hour prior to the test. Do not eat any food 1 hour prior to test. Do NOT take Metoprolol  (Troprol XL) the day of your test. Take Ivabradine (Corlanor) 15 mg two hours prior to test. FEMALES- please wear underwire-free bra if available, avoid dresses & tight clothing  After the Test: Drink plenty of water. After receiving IV contrast, you may experience a mild flushed feeling. This is normal. On occasion, you may experience a mild rash up to 24 hours after the test. This is not dangerous. If this occurs, you can take Benadryl 25 mg, Zyrtec, Claritin, or Allegra and increase your fluid intake. (Patients taking Tikosyn should avoid Benadryl, and may take Zyrtec, Claritin, or Allegra) If you experience trouble breathing, this can be serious. If it is severe call 911 IMMEDIATELY. If it is mild, please call our office.  We will call to schedule your test 2-4 weeks out understanding that some insurance companies will need an authorization prior to the service being performed.   For more information and frequently asked questions, please visit our website : [email or web address]  For non-scheduling related questions, please contact the  cardiac imaging nurse navigator should you have any questions/concerns: Cardiac Imaging Nurse Navigators Direct Office Dial: [phone]   For scheduling needs, including cancellations and rescheduling, please call [place], [phone].   Follow-Up: At [facility], you and your health needs are our priority.   As part of our continuing mission to provide you with exceptional heart care, our providers are all part of one team.  This team includes your primary Cardiologist (physician) and Advanced Practice Providers or APPs (Physician Assistants and Nurse Practitioners) who all work together to provide you with the care you need, when you need it.  Your next appointment:   1 year(s)  Provider:   Dr. Gaylyn Keas

## 2024-04-03 LAB — COMPREHENSIVE METABOLIC PANEL WITH GFR
ALT: 43 IU/L — ABNORMAL HIGH (ref 0–32)
AST: 53 IU/L — ABNORMAL HIGH (ref 0–40)
Albumin: 4.4 g/dL (ref 3.9–4.9)
Alkaline Phosphatase: 94 IU/L (ref 44–121)
BUN/Creatinine Ratio: 13 (ref 9–23)
BUN: 12 mg/dL (ref 6–24)
Bilirubin Total: 0.8 mg/dL (ref 0.0–1.2)
CO2: 22 mmol/L (ref 20–29)
Calcium: 9.7 mg/dL (ref 8.7–10.2)
Chloride: 103 mmol/L (ref 96–106)
Creatinine, Ser: 0.96 mg/dL (ref 0.57–1.00)
Globulin, Total: 2.6 g/dL (ref 1.5–4.5)
Glucose: 92 mg/dL (ref 70–99)
Potassium: 4.9 mmol/L (ref 3.5–5.2)
Sodium: 142 mmol/L (ref 134–144)
Total Protein: 7 g/dL (ref 6.0–8.5)
eGFR: 72 mL/min/{1.73_m2} (ref >59)

## 2024-04-03 LAB — LIPID PANEL
Chol/HDL Ratio: 3.8 ratio (ref 0.0–4.4)
Cholesterol, Total: 177 mg/dL (ref 100–199)
HDL: 47 mg/dL (ref >39)
LDL Chol Calc (NIH): 110 mg/dL — ABNORMAL HIGH (ref 0–99)
Triglycerides: 112 mg/dL (ref 0–149)
VLDL Cholesterol Cal: 20 mg/dL (ref 5–40)

## 2024-04-04 ENCOUNTER — Ambulatory Visit: Payer: Self-pay | Admitting: Cardiology

## 2024-04-04 DIAGNOSIS — R931 Abnormal findings on diagnostic imaging of heart and coronary circulation: Secondary | ICD-10-CM

## 2024-04-04 DIAGNOSIS — Z79899 Other long term (current) drug therapy: Secondary | ICD-10-CM

## 2024-04-06 ENCOUNTER — Encounter: Payer: Self-pay | Admitting: Cardiology

## 2024-04-06 DIAGNOSIS — R931 Abnormal findings on diagnostic imaging of heart and coronary circulation: Secondary | ICD-10-CM | POA: Insufficient documentation

## 2024-04-08 NOTE — Telephone Encounter (Signed)
 Call to patient to review lab results. Patient verbalizes understanding of LDL but waiting on coronary CT results. Also reviewed elevated LFT, patient states she has an appt w/ her gastroenterologist Dr. Waymon Hail in June and will discuss with her.

## 2024-04-16 ENCOUNTER — Telehealth (HOSPITAL_COMMUNITY): Payer: Self-pay | Admitting: Emergency Medicine

## 2024-04-16 NOTE — Telephone Encounter (Signed)
 Reaching out to patient to offer assistance regarding upcoming cardiac imaging study; pt verbalizes understanding of appt date/time, parking situation and where to check in, pre-test NPO status and medications ordered, and verified current allergies; name and call back number provided for further questions should they arise Rockwell Alexandria RN Navigator Cardiac Imaging [facility] [phone] office [phone] cell

## 2024-04-17 ENCOUNTER — Ambulatory Visit (HOSPITAL_COMMUNITY)
Admission: RE | Admit: 2024-04-17 | Discharge: 2024-04-17 | Disposition: A | Discharge status: Home / Self Care | Source: Ambulatory Visit | Attending: Cardiology | Admitting: Cardiology

## 2024-04-17 DIAGNOSIS — G4733 Obstructive sleep apnea (adult) (pediatric): Secondary | ICD-10-CM | POA: Insufficient documentation

## 2024-04-17 DIAGNOSIS — R079 Chest pain, unspecified: Secondary | ICD-10-CM | POA: Diagnosis present

## 2024-04-17 DIAGNOSIS — Z136 Encounter for screening for cardiovascular disorders: Secondary | ICD-10-CM | POA: Diagnosis not present

## 2024-04-17 DIAGNOSIS — I493 Ventricular premature depolarization: Secondary | ICD-10-CM | POA: Insufficient documentation

## 2024-04-17 MED ORDER — METOPROLOL TARTRATE 5 MG/5ML IV SOLN
10.0000 mg | Freq: Once | INTRAVENOUS | Status: DC | PRN
Start: 2024-04-17 — End: 2024-04-18

## 2024-04-17 MED ORDER — IOHEXOL 350 MG/ML SOLN
150.0000 mL | Freq: Once | INTRAVENOUS | Status: AC | PRN
  Administered 2024-04-17: 150 mL via INTRAVENOUS

## 2024-04-17 MED ORDER — DILTIAZEM HCL 25 MG/5ML IV SOLN
10.0000 mg | INTRAVENOUS | Status: DC | PRN
Start: 2024-04-17 — End: 2024-04-18

## 2024-04-17 MED ORDER — NITROGLYCERIN 0.4 MG SL SUBL
0.8000 mg | SUBLINGUAL_TABLET | Freq: Once | SUBLINGUAL | Status: AC
Start: 2024-04-17 — End: 2024-04-17
  Administered 2024-04-17: 0.8 mg via SUBLINGUAL

## 2024-04-22 ENCOUNTER — Encounter: Payer: Self-pay | Admitting: Cardiology

## 2024-04-23 ENCOUNTER — Encounter: Payer: Self-pay | Admitting: Gastroenterology

## 2024-04-23 ENCOUNTER — Ambulatory Visit: Admitting: Gastroenterology

## 2024-04-23 VITALS — BP 107/93 | HR 91 | Temp 98.4°F | Ht 63.0 in | Wt 320.6 lb

## 2024-04-23 DIAGNOSIS — K5900 Constipation, unspecified: Secondary | ICD-10-CM | POA: Diagnosis not present

## 2024-04-23 DIAGNOSIS — K219 Gastro-esophageal reflux disease without esophagitis: Secondary | ICD-10-CM

## 2024-04-23 DIAGNOSIS — K439 Ventral hernia without obstruction or gangrene: Secondary | ICD-10-CM

## 2024-04-23 DIAGNOSIS — R42 Dizziness and giddiness: Secondary | ICD-10-CM | POA: Diagnosis not present

## 2024-04-23 DIAGNOSIS — R1013 Epigastric pain: Secondary | ICD-10-CM

## 2024-04-23 NOTE — Patient Instructions (Addendum)
 For heartburn, upper abdominal pain: Continue to hold your pantoprazole  given concern for possible headaches. Start famotidine(Pepcid) 20 mg daily. Follow a GERD diet:  Avoid fried, fatty, greasy, spicy, citrus foods. Avoid caffeine and carbonated beverages. Avoid chocolate. Try eating 4-6 small meals a day rather than 3 large meals. Do not eat within 3 hours of laying down. Prop head of bed up on wood or bricks to create a 6 inch incline. We can discuss upper endoscopy and next visit if your symptoms worsen or are not improved with famotidine.  For hernia: Monitor for changes in bowel habits,  constant abdominal pain, blood in the stool.  Changes in ability to eat  For constipation: Try to avoid straining as this does not help your recent dizziness episodes that you have been having. If you are consistently straining I want you to start with Metamucil 1 tablespoon daily in 8 ounces of water If you feel like you are having harder stools or not having any improvement with Metamucil then start MiraLAX 1 capful daily as needed.  For dizziness: Take your time with position changes Take 12.5 mg of the meclizine at onset of dizziness. Monitor for drowsiness and if significantly dizzy or drowsy please do not drive. Please discuss this again with your PCP as well as the dizziness with eyes closed and issues with the headaches and eye pain.  We will plan to follow-up in about 8 weeks.  It was a pleasure to see you today. I want to create trusting relationships with patients. If you receive a survey regarding your visit,  I greatly appreciate you taking time to fill this out on paper or through your MyChart. I value your feedback.  Julian Obey, MSN, FNP-BC, AGACNP-BC [facility]

## 2024-04-23 NOTE — Progress Notes (Signed)
 GI Office Note    Referring Provider: Lavell Bari LABOR, FNP Primary Care Physician:  Lavell Bari LABOR, FNP Primary Gastroenterologist: Carlin POUR. Cindie, DO  Date:  04/23/2024  ID:  Barbara Hinton, DOB [birth year], MRN [id number]   Chief Complaint   Chief Complaint  Patient presents with   Follow-up    Pt is having issues with heartburn , pains down R/L side, and problems with hernia. Pt has been to ED X twice.   History of Present Illness  Barbara Hinton is a 53 y.o. female with a history of asthma, OSA, constipation, and fatty liver presenting today with complaint of heartburn, abdominal pain after ED visit x 2.  Office visit 04/11/2022.  Reported difficulty completing Cologuard due to finding a [employer] facility to send sample.  Having nausea once or twice per week, taking Protonix  daily.  Frequent belching with eating.  Dr Nunzio makes her sick.  Drinks water and tea in the mornings and is cut out spicy foods and that seems to have improved her reflux.  Working on diet changes.  Occasionally feel if she could burp, feeling air stuck in her chest at times.  Doing okay with constipation and having at least 1 BM daily, sometimes 2.  Cut back on fiber.  Some mild incomplete emptying.  Abdominal pain improved since she stopped lifting heavy things at work.  Given history of fatty liver and mildly elevated liver enzymes, HFP checked.  Advised to continue PPI daily, Gas-X as needed for belching, continue fiber daily.  If incomplete emptying lower abdominal discomfort she may take MiraLAX.  Advised to complete Cologuard and follow-up in 6 months or sooner if needed.   Lab 04/12/2022 with normal LFTs. Cologuard not yet completed  Last office visit 12/07/22 performed virtually.  Reflux reportedly controlled with pantoprazole  once daily although does forget at times.  Denies any significant breakthrough symptoms nausea or vomiting.  Had a good appetite but did report some occasional belching and excessive  gas.  Sometimes will use Tums and that will give her relief.  Denied any dysphagia.  Did report some dizziness, cough, and was having some heart palpitations and was undergoing evaluation with cardiology.  Reported bowel movement daily, sometimes twice daily denied any abdominal pain.  Advise continue pantoprazole  40 mg daily and monitor for recurrence of chest pain.  Advised famotidine once daily if she felt like she was having side effects from pantoprazole .  Advised on weight loss and completion of Cologuard.  Follow low-fat/low-cholesterol diet.  ED visit 03/24/2024 for chest pain.  She also had reported some lightheadedness and any actual palpitations.  Also noted a headache for 2-3 days.  EKG with normal sinus rhythm.  No prolonged QRS.  Troponins unremarkable.  Physical exam benign per EDP.  ED visit to [facility] 04/10/2024.  Reported dizziness.  Discussed with her that they felt as though this was BPPV or paroxysmal vertigo.  Her vital signs were stable.  EKG was normal.  She did admit to nausea at times but no symptoms the stool exam reported a palpable hernia that was nontender to palpation with scant swelling to the bilateral lower extremities.  She was given Maalox and Pepcid for acid reflux.  She is also given meclizine and IV fluids.  Labs normal.  Coronary CTA 04/17/2024: - Coronary artery calcium score 0.5.  Hide risk for future cardiac events. - No obstructive CAD  Today:  She states she has been having dizzy spells and at first  having tingling in her arms and had bunch of tests and then started feeling better and then scheduled appointment with cardiology and states before her appointment there she was at work and while driving got dizzy and worked called EMS and BG checked x 2 and slightly elevated and then told her that she had vertigo. ED had also told her to follow up with us  given pain/burning in her chest at the time as well. She is unsure if she was anxious as well but reports  she could feel the dizziness coming on and then she states her oxygen levels dropped at that time.   She has noticed her hernia midline is getting worse and sometimes will have pain along the right and left side of that region. Does not happen significantly often.   Burning in the epigastric pain that was super intense for several minutes and  then go away. Pepcid helped in the Ed visit but has not happened again since the ED visit. Felt nauseas some but not the biggest system. Does have some early satiety compared to her usual. Has been trying to eat more fruits and cut out sugary drink. Only diet drinks and increased water intake. No strenuous activity when this has occurred. Was not eating either when this happened.   Quit the pantoprazole  due to headaches - taking otc famotidine as needed. Headaches starts out as sharp pain near her eye and then it goes away.   Sometimes has good BM and sometimes not - sometime has to strain. Goes everyday, sometimes twice daily. Did not take miralax too often in the past.   Also taking meclizine otc for dizzy spells (no spells in the last 2 days). Sometime when she closes her eyes she feels like things are shaking.   Grandfather and father with history of MI's. Mothers father also with history of MI.   Wt Readings from Last 3 Encounters:  04/23/24 (!) 320 lb 9.6 oz (145.4 kg)  04/02/24 (!) 320 lb (145.2 kg)  03/24/24 (!) 321 lb (145.6 kg)    Current Outpatient Medications  Medication Sig Dispense Refill   albuterol  (VENTOLIN  HFA) 108 (90 Base) MCG/ACT inhaler INHALE 2 PUFFS INTO THE LUNGS EVERY 6 HOURS AS NEEDED FOR WHEEZE OR SHORTNESS OF BREATH 6.7 each 2   aspirin 81 MG tablet Take 81 mg by mouth 2 (two) times a week.     calcipotriene  (DOVONOX) 0.005 % ointment Apply topically 2 (two) times daily. 60 g 0   fluticasone -salmeterol (WIXELA INHUB) 100-50 MCG/ACT AEPB INHALE 1 PUFF INTO THE LUNGS TWICE A DAY 60 each 1   metoprolol  succinate (TOPROL  XL)  25 MG 24 hr tablet Take 0.5 tablets (12.5 mg total) by mouth daily. 45 tablet 3   pantoprazole  (PROTONIX ) 40 MG tablet Take 1 tablet (40 mg total) by mouth daily. 30 minutes before breakfast (Patient taking differently: Take 1 tablet (40 mg total) by mouth daily. 30 minutes before breakfast) 90 tablet 3   benzonatate  (TESSALON ) 200 MG capsule Take 1 capsule (200 mg total) by mouth 3 (three) times daily as needed. (Patient not taking: Reported on 04/23/2024) 30 capsule 1   betamethasone  dipropionate 0.05 % cream Apply topically 2 (two) times daily. (Patient not taking: Reported on 04/23/2024) 30 g 0   No current facility-administered medications for this visit.    Past Medical History:  Diagnosis Date   Agatston coronary artery calcium score less than 100 04/2024   cor cal score 0.5   Asthma  OSA (obstructive sleep apnea) 12/04/2017   Mild with AHI overall 9.8/hr but during REM sleep severe with AHI 53.8/hr and nocturnal hypoxemia now on CPAP at 10cm H2O.     PVC (premature ventricular contraction)     Past Surgical History:  Procedure Laterality Date   CESAREAN SECTION      Family History  Problem Relation Age of Onset   Diabetes Mother    Diabetes Father    Hypertension Father    Cirrhosis Brother        liver transplant   Hypertension Brother    Heart attack Maternal Grandfather    Heart attack Paternal Grandfather    Colon cancer Neg Hx    Colon polyps Neg Hx     Allergies as of 04/23/2024   (No Known Allergies)    Social History   Socioeconomic History   Marital status: Married    Spouse name: Not on file   Number of children: Not on file   Years of education: Not on file   Highest education level: Not on file  Occupational History   Not on file  Tobacco Use   Smoking status: Never    Passive exposure: Yes   Smokeless tobacco: Never  Substance and Sexual Activity   Alcohol use: No   Drug use: No   Sexual activity: Not on file  Other Topics Concern   Not  on file  Social History Narrative   Not on file   Social Drivers of Health   Financial Resource Strain: Not on file  Food Insecurity: Not on file  Transportation Needs: Not on file  Physical Activity: Not on file  Stress: Not on file  Social Connections: Not on file     Review of Systems   Gen: Denies fever, chills, anorexia. Denies fatigue, weakness, weight loss.  CV: = radiating chest pain. Denies palpitations, syncope, peripheral edema, and claudication. Resp: Denies dyspnea at rest, cough, wheezing, coughing up blood, and pleurisy. GI: See HPI Derm: Denies rash, itching, dry skin Psych: Denies depression, anxiety, memory loss, confusion. No homicidal or suicidal ideation.  Heme: Denies bruising, bleeding, and enlarged lymph nodes. Neuro: + dizziness, + headaches. Denies confusion.   Physical Exam   BP (!) 107/93   Pulse 91   Temp 98.4 F (36.9 C)   Ht 5' 3 (1.6 m)   Wt (!) 320 lb 9.6 oz (145.4 kg)   BMI 56.79 kg/m   General:   Alert and oriented. No distress noted. Pleasant and cooperative.  Head:  Normocephalic and atraumatic. Eyes:  Conjuctiva clear without scleral icterus. Mouth:  Oral mucosa pink and moist. Good dentition. No lesions. Lungs:  Clear to auscultation bilaterally. No wheezes, rales, or rhonchi. No distress.  Heart:  S1, S2 present without murmurs appreciated.  Abdomen:  +BS, soft, non-distended. Void noted to mid abdomen -diastasis recti vs ventral hernia. No rebound or guarding. Mild ttp to epigastrium. UTA HSM. Rectal: deferred Msk:  Symmetrical without gross deformities. Normal posture. Extremities:  Without edema. Neurologic:  Alert and  oriented x4 Psych:  Alert and cooperative. Normal mood and affect.  Assessment  Barbara Hinton is a 53 y.o. female with a history of GERD, dizziness s/p ED visit x 2 with concern for possible vertigo, asthma, heart palpitations previously evaluated by cardiology, OSA, constipation, and fatty liver  presenting today with concerns regarding dizziness, heartburn, epigastric pain, and constipation.   GERD, heartburn, epigastric pain: Has had multiple ED visits in which the dizziness  will starts but that she also has some radiating chest pain as well as epigastric pain.  Pain can be intense in the epigastric region for several minutes and then go away.  She was given Pepcid in the ED which did help her symptoms.  Given concern for pantoprazole  given her headaches in the past she has not taken any of this.  Has been trying to work better on her diet with eating more fruits and veggies, decreasing caffeine intake and increasing water intake.  Unable to identify any specific trigger to her pain and symptoms.  No significant vomiting.  Given the dizziness that is occurring, anxiety could also be playing a role.  EKGs and cardiac workup have thus far been benign.  Labs have been stable.  Encouraged her that since she had relief with famotidine she uses daily and monitor for any recurrent symptoms given she has not had any recurrence of the symptoms in the last 2 days.  If PPI needs to be resumed, we could trial another PPI as it is unclear if headaches are related to medication or other cause.  Hernia: Has had chronic midline abdominal avoid concerning for diastases recti versus ventral hernia.  Does not appear to have any incarceration of bowel at this time.  Occasionally will have pain midline on either side that is short-lived without the need for any intervention.  Will continue to monitor for now, advised her to reach out if this worsens.  Dizziness: Has had multiple ED visits for this.  Had taking meclizine once before, had tried over-the-counter Dramamine but reported she was way too sleepy with this.  Occasionally feels like her body or the things around her shaking when she closes her eyes.  Unclear if this is vertigo but this is highly differential.  Has had some associated headaches and eye pain.   Encouraged her to continue to follow-up with her PCP regarding this.  I did encourage her that when she started taking meclizine that she should try the lowest dose of 12.5 at the first onset of symptoms.  Constipation: Continues to report 1-2 bowel movements daily although occasionally does have to strain.  Occasionally is good consistency and sometimes can be a bit harder in nature.  Not currently taking any MiraLAX or extra fiber supplementation.  Encouraged that given her dizziness that she should avoid any straining therefore I would recommend starting with Metamucil to help with better bowel regularity and easier bowel movements.  If she continues to have some issues with straining that she should start MiraLAX.  Can address colon cancer screening and hepatic steatosis at next visit pending current symptoms.   PLAN   Continue famotidine, encouraged 20 mg once daily instead of as needed.  Hold pantoprazole  given concern for headaches. GERD diet Monitor for pain in the hernia region, changes in bowel habits, etc.  Metamucil 1 tablespoon daily in 8 ounces of water. MiraLAX if no improvement with Metamucil. Meclizine 12.5 mg with onset of dizziness Ongoing follow-up with cardiology and PCP Follow-up in 8 weeks  Charmaine Melia, MSN, FNP-BC, AGACNP-BC [facility]

## 2024-04-28 ENCOUNTER — Ambulatory Visit (INDEPENDENT_AMBULATORY_CARE_PROVIDER_SITE_OTHER): Payer: Commercial Managed Care - PPO | Admitting: Family

## 2024-04-28 ENCOUNTER — Encounter: Payer: Self-pay | Admitting: Family

## 2024-04-28 VITALS — BP 104/66 | HR 79 | Temp 97.5°F | Ht 63.0 in | Wt 320.4 lb

## 2024-04-28 DIAGNOSIS — G4733 Obstructive sleep apnea (adult) (pediatric): Secondary | ICD-10-CM

## 2024-04-28 DIAGNOSIS — K2100 Gastro-esophageal reflux disease with esophagitis, without bleeding: Secondary | ICD-10-CM

## 2024-04-28 DIAGNOSIS — J45909 Unspecified asthma, uncomplicated: Secondary | ICD-10-CM

## 2024-04-28 DIAGNOSIS — R42 Dizziness and giddiness: Secondary | ICD-10-CM

## 2024-04-28 DIAGNOSIS — I493 Ventricular premature depolarization: Secondary | ICD-10-CM

## 2024-04-28 LAB — CBC WITH DIFFERENTIAL/PLATELET
Basophils Absolute: 0 10*3/uL (ref 0.0–0.2)
Basos: 1 %
EOS (ABSOLUTE): 0.2 10*3/uL (ref 0.0–0.4)
Eos: 3 %
Hematocrit: 45.5 % (ref 34.0–46.6)
Hemoglobin: 14.8 g/dL (ref 11.1–15.9)
Immature Grans (Abs): 0 10*3/uL (ref 0.0–0.1)
Immature Granulocytes: 0 %
Lymphocytes Absolute: 2.5 10*3/uL (ref 0.7–3.1)
Lymphs: 42 %
MCH: 29 pg (ref 26.6–33.0)
MCHC: 32.5 g/dL (ref 31.5–35.7)
MCV: 89 fL (ref 79–97)
Monocytes Absolute: 0.4 10*3/uL (ref 0.1–0.9)
Monocytes: 6 %
Neutrophils Absolute: 2.9 10*3/uL (ref 1.4–7.0)
Neutrophils: 48 %
Platelets: 272 10*3/uL (ref 150–450)
RBC: 5.11 x10E6/uL (ref 3.77–5.28)
RDW: 12.7 % (ref 11.7–15.4)
WBC: 6 10*3/uL (ref 3.4–10.8)

## 2024-04-28 LAB — CMP14+EGFR
ALT: 37 IU/L — ABNORMAL HIGH (ref 0–32)
AST: 41 IU/L — ABNORMAL HIGH (ref 0–40)
Albumin: 4 g/dL (ref 3.9–4.9)
Alkaline Phosphatase: 94 IU/L (ref 44–121)
BUN/Creatinine Ratio: 9 (ref 9–23)
BUN: 9 mg/dL (ref 6–24)
Bilirubin Total: 0.7 mg/dL (ref 0.0–1.2)
CO2: 21 mmol/L (ref 20–29)
Calcium: 9.6 mg/dL (ref 8.7–10.2)
Chloride: 104 mmol/L (ref 96–106)
Creatinine, Ser: 1.03 mg/dL — ABNORMAL HIGH (ref 0.57–1.00)
Globulin, Total: 2.8 g/dL (ref 1.5–4.5)
Glucose: 122 mg/dL — ABNORMAL HIGH (ref 70–99)
Potassium: 4.4 mmol/L (ref 3.5–5.2)
Sodium: 140 mmol/L (ref 134–144)
Total Protein: 6.8 g/dL (ref 6.0–8.5)
eGFR: 66 mL/min/{1.73_m2} (ref >59)

## 2024-04-28 NOTE — Progress Notes (Signed)
 Subjective:    Patient ID: Barbara Hinton, female    DOB: 02-18-1971, 53 y.o.   MRN: [id number]  Chief Complaint  Patient presents with   Medical Management of Chronic Issues    Went to ER twice and cardiology and gastro    Pt presents to the office today for chronic follow up.   She is morbid obese with a BMI of 56.     She is followed by Cardiologists for palpitations and PVC. Taking metoprolol . She was have dizziness and had CT coronary on 04/17/24 that showed, 1. Coronary artery calcium score 0.5 Agatston units. This places the patient in the 83rd percentile for age and gender, suggesting high risk for future cardiac events.   2.  No obstructive CAD.   She has OSA and uses CPAP.   She is followed by GI for GERD.  Gastroesophageal Reflux She complains of belching, early satiety and heartburn. She reports no coughing or no wheezing. This is a chronic problem. The current episode started more than 1 year ago. The problem occurs occasionally. The symptoms are aggravated by certain foods. Risk factors include obesity. She has tried a histamine-2 antagonist for the symptoms. The treatment provided moderate relief.  Asthma There is no cough, shortness of breath or wheezing. This is a chronic problem. The current episode started more than 1 year ago. The problem occurs intermittently. Associated symptoms include heartburn. Her symptoms are aggravated by exercise. Her symptoms are alleviated by rest. Her past medical history is significant for asthma.  Rash This is a chronic problem. The current episode started more than 1 year ago. The problem has been waxing and waning since onset. Location: right lower leg. The rash is characterized by itchiness and dryness. She was exposed to nothing. Pertinent negatives include no cough or shortness of breath. Past treatments include anti-itch cream, antihistamine and oral steroids. The treatment provided mild relief. Her past medical history is  significant for asthma.  Dizziness This is a recurrent problem. The current episode started more than 1 month ago. The problem occurs intermittently. The problem has been waxing and waning. Associated symptoms include a rash. Pertinent negatives include no coughing. The symptoms are aggravated by twisting and bending. She has tried rest for the symptoms. The treatment provided mild relief.      Review of Systems  Respiratory:  Negative for cough, shortness of breath and wheezing.   Gastrointestinal:  Positive for heartburn.  Skin:  Positive for rash.  Neurological:  Positive for dizziness.  All other systems reviewed and are negative.   Social History   Socioeconomic History   Marital status: Married    Spouse name: Not on file   Number of children: Not on file   Years of education: Not on file   Highest education level: 12th grade  Occupational History   Not on file  Tobacco Use   Smoking status: Never    Passive exposure: Yes   Smokeless tobacco: Never  Substance and Sexual Activity   Alcohol use: No   Drug use: No   Sexual activity: Not on file  Other Topics Concern   Not on file  Social History Narrative   Not on file   Social Drivers of Health   Financial Resource Strain: Patient Declined (04/25/2024)   Overall Financial Resource Strain (CARDIA)    Difficulty of Paying Living Expenses: Patient declined  Food Insecurity: Patient Declined (04/25/2024)   Hunger Vital Sign    Worried About Running Out  of Food in the Last Year: Patient declined    Ran Out of Food in the Last Year: Patient declined  Transportation Needs: No Transportation Needs (04/25/2024)   PRAPARE - Administrator, Civil Service (Medical): No    Lack of Transportation (Non-Medical): No  Physical Activity: Inactive (04/25/2024)   Exercise Vital Sign    Days of Exercise per Week: 1 day    Minutes of Exercise per Session: 0 min  Stress: Stress Concern Present (04/25/2024)   [employer]ational [employer] - Occupational Stress Questionnaire    Feeling of Stress: To some extent  Social Connections: Unknown (04/25/2024)   Social Connection and Isolation Panel    Frequency of Communication with Friends and Family: Patient declined    Frequency of Social Gatherings with Friends and Family: More than three times a week    Attends Religious Services: 1 to 4 times per year    Active Member of [employer] or Organizations: Patient declined    Attends Engineer, structural: Not on file    Marital Status: Married   Family History  Problem Relation Age of Onset   Diabetes Mother    Diabetes Father    Hypertension Father    Cirrhosis Brother        liver transplant   Hypertension Brother    Heart attack Maternal Grandfather    Heart attack Paternal Grandfather    Colon cancer Neg Hx    Colon polyps Neg Hx         Objective:   Physical Exam Vitals reviewed.  Constitutional:      General: She is not in acute distress.    Appearance: She is well-developed. She is obese.  HENT:     Head: Normocephalic and atraumatic.     Right Ear: Tympanic membrane normal.     Left Ear: Tympanic membrane normal.   Eyes:     Pupils: Pupils are equal, round, and reactive to light.   Neck:     Thyroid : No thyromegaly.   Cardiovascular:     Rate and Rhythm: Normal rate and regular rhythm.     Heart sounds: Normal heart sounds. No murmur heard. Pulmonary:     Effort: Pulmonary effort is normal. No respiratory distress.     Breath sounds: No wheezing.  Abdominal:     General: Bowel sounds are normal. There is no distension.     Palpations: Abdomen is soft.     Tenderness: There is no abdominal tenderness.   Musculoskeletal:        General: No tenderness. Normal range of motion.     Cervical back: Normal range of motion and neck supple.   Skin:    General: Skin is warm and dry.         Comments: Dry silver rash on right lower leg that is approx 9X10 cm    Neurological:     Mental Status: She is alert and oriented to person, place, and time.     Cranial Nerves: No cranial nerve deficit.     Deep Tendon Reflexes: Reflexes are normal and symmetric.   Psychiatric:        Behavior: Behavior normal.        Thought Content: Thought content normal.        Judgment: Judgment normal.       BP 104/66   Pulse 79   Temp (!) 97.5 F (36.4 C) (Temporal)   Ht 5' 3 (1.6 m)  Wt (!) 320 lb 6.4 oz (145.3 kg)   SpO2 95%   BMI 56.76 kg/m      Assessment & Plan:  Barbara Hinton comes in today with chief complaint of Medical Management of Chronic Issues (Went to ER twice and cardiology and gastro )   Diagnosis and orders addressed:  1. Mild asthma without complication, unspecified whether persistent (Primary) - CMP14+EGFR - CBC with Differential/Platelet  2. Obesity, morbid (HCC) - CMP14+EGFR - CBC with Differential/Platelet  3. OSA (obstructive sleep apnea) - CMP14+EGFR - CBC with Differential/Platelet  4. Gastroesophageal reflux disease with esophagitis without hemorrhage -Keep follow up with GI - CMP14+EGFR - CBC with Differential/Platelet  5. PVC (premature ventricular contraction)  - CMP14+EGFR - CBC with Differential/Platelet  6. Dizziness Epley exercises discussed, handout given  ENT referral pending  - CMP14+EGFR - CBC with Differential/Platelet - Ambulatory referral to ENT   Labs pending Epley exercises discussed- handout given  Referral to ENT pending  Continue betamethasone  and calcipotriene   for psoriasis. Avoid scratching.  Avoid caffeine  Continue current medications  Keep follow up with specialists  Health Maintenance reviewed Diet and exercise encouraged  Return in about 4 months (around 08/28/2024), or if symptoms worsen or fail to improve.    Bari Learn, FNP

## 2024-04-28 NOTE — Patient Instructions (Signed)
 Benign Positional Vertigo Vertigo is the feeling that you or your surroundings are moving when they are not. Benign positional vertigo is the most common form of vertigo. This is usually a harmless condition (benign). This condition is positional. This means that symptoms are triggered by certain movements and positions. This condition can be dangerous if it occurs while you are doing something that could cause harm to yourself or others. This includes activities such as driving or operating machinery. What are the causes? The inner ear has fluid-filled canals that help your brain sense movement and balance. When the fluid moves, the brain receives messages about your body's position. With benign positional vertigo, calcium crystals in the inner ear break free and disturb the inner ear area. This causes your brain to receive confusing messages about your body's position. What increases the risk? You are more likely to develop this condition if: You are a woman. You are 64 years of age or older. You have recently had a head injury. You have an inner ear disease. What are the signs or symptoms? Symptoms of this condition usually happen when you move your head or your eyes in different directions. Symptoms may start suddenly and usually last for less than a minute. They include: Loss of balance and falling. Feeling like you are spinning or moving. Feeling like your surroundings are spinning or moving. Nausea and vomiting. Blurred vision. Dizziness. Involuntary eye movement (nystagmus). Symptoms can be mild and cause only minor problems, or they can be severe and interfere with daily life. Episodes of benign positional vertigo may return (recur) over time. Symptoms may also improve over time. How is this diagnosed? This condition may be diagnosed based on: Your medical history. A physical exam of the head, neck, and ears. Positional tests to check for or stimulate vertigo. You may be asked to  turn your head and change positions, such as going from sitting to lying down. A health care provider will watch for symptoms of vertigo. You may be referred to a health care provider who specializes in ear, nose, and throat problems (ENT or otolaryngologist) or a provider who specializes in disorders of the nervous system (neurologist). How is this treated?  This condition may be treated in a session in which your health care provider moves your head in specific positions to help the displaced crystals in your inner ear move. Treatment for this condition may take several sessions. Surgery may be needed in severe cases, but this is rare. In some cases, benign positional vertigo may resolve on its own in 2-4 weeks. Follow these instructions at home: Safety Move slowly. Avoid sudden body or head movements or certain positions, as told by your health care provider. Avoid driving or operating machinery until your health care provider says it is safe. Avoid doing any tasks that would be dangerous to you or others if vertigo occurs. If you have trouble walking or keeping your balance, try using a cane for stability. If you feel dizzy or unstable, sit down right away. Return to your normal activities as told by your health care provider. Ask your health care provider what activities are safe for you. General instructions Take over-the-counter and prescription medicines only as told by your health care provider. Drink enough fluid to keep your urine pale yellow. Keep all follow-up visits. This is important. Contact a health care provider if: You have a fever. Your condition gets worse or you develop new symptoms. Your family or friends notice any behavioral changes.  You have nausea or vomiting that gets worse. You have numbness or a prickling and tingling sensation. Get help right away if you: Have difficulty speaking or moving. Are always dizzy or faint. Develop severe headaches. Have weakness in  your legs or arms. Have changes in your hearing or vision. Develop a stiff neck. Develop sensitivity to light. These symptoms may represent a serious problem that is an emergency. Do not wait to see if the symptoms will go away. Get medical help right away. Call your local emergency services (911 in the [place] Do not drive yourself to the hospital. Summary Vertigo is the feeling that you or your surroundings are moving when they are not. Benign positional vertigo is the most common form of vertigo. This condition is caused by calcium crystals in the inner ear that become displaced. This causes a disturbance in an area of the inner ear that helps your brain sense movement and balance. Symptoms include loss of balance and falling, feeling that you or your surroundings are moving, nausea and vomiting, and blurred vision. This condition can be diagnosed based on symptoms, a physical exam, and positional tests. Follow safety instructions as told by your health care provider and keep all follow-up visits. This is important. This information is not intended to replace advice given to you by your health care provider. Make sure you discuss any questions you have with your health care provider. Document Revised: 05/14/2023 Document Reviewed: 05/14/2023 Elsevier Patient Education  2024 [employer].

## 2024-04-29 ENCOUNTER — Ambulatory Visit: Payer: Self-pay | Admitting: Family

## 2024-04-30 LAB — SPECIMEN STATUS REPORT

## 2024-04-30 LAB — HGB A1C W/O EAG: Hgb A1c MFr Bld: 5.8 % — ABNORMAL HIGH (ref 4.8–5.6)

## 2024-04-30 MED ORDER — ATORVASTATIN CALCIUM 10 MG PO TABS: 10.0000 mg | ORAL_TABLET | Freq: Every day | ORAL | 3 refills | Status: AC

## 2024-04-30 NOTE — Telephone Encounter (Signed)
 Mc to patient advise Dr. Shlomo would like her to please start Atorvastatin 10mg  daily and repeat FLP and ALT in 6 weeks, orders placed.

## 2024-04-30 NOTE — Telephone Encounter (Signed)
-----   Message from Wilbert Bihari sent at 04/27/2024  9:00 PM EDT ----- Regarding: RE: Thank you  Barbara Hinton , please start Atorvastatin 10mg  daily and repeat FLP and ALT in 6 weeks  ----- Message ----- From: Kennedy Charmaine CROME, NP Sent: 04/24/2024   4:24 PM EDT To: Wilbert JONELLE Bihari, MD Subject: RE:                                            She has had elevated LFTs in the past and I'm fairly certain it's fatty liver. Statins even though they can cause a bump in LFTs can actually help with liver fibrosis so I'm okay with starting low dose statin and titrating as tolerated. We just didn't have enough time in the visit yesterday to address all the things. ----- Message ----- From: Bihari Wilbert JONELLE, MD Sent: 04/24/2024   8:44 AM EDT To: Barbara Hinton CROME Sar, RN; Charmaine CROME Kennedy, NP  I think you saw this patient yesterday.  She has reflux but also has elevated LFTs.  She does have minimal coronary calcium and would like to start low-dose statin but need a GI blessing first.  Not  sure if elevated LFTs are due to fatty liver ----- Message ----- From: Sar Barbara Hinton CROME, RN Sent: 04/23/2024   5:07 PM EDT To: Wilbert JONELLE Bihari, MD  There is a note in epic from today where she saw nurse practitioner at GI [facility] office. It looks like they addressed her heartburn but not her LFT's. Barbara Hinton ----- Message ----- From: Bihari Wilbert JONELLE, MD Sent: 04/22/2024   5:04 PM EDT To: Barbara Hinton CROME Sar, RN  Coronary CTA showed cor Cal score of 0.5 with no obstructive CAD.  LDL goal < 70.  Please find out if she has seen GI yet about her elevated LFTs.   ----- Message ----- From: Interface, Rad Results In Sent: 04/17/2024   9:53 PM EDT To: Wilbert JONELLE Bihari, MD

## 2024-04-30 NOTE — Addendum Note (Signed)
 Addended by: JANIT GENI CROME on: 04/30/2024 05:09 PM   Modules accepted: Orders

## 2024-05-01 ENCOUNTER — Other Ambulatory Visit: Payer: Self-pay | Admitting: Family

## 2024-05-01 DIAGNOSIS — R7303 Prediabetes: Secondary | ICD-10-CM | POA: Insufficient documentation

## 2024-05-01 MED ORDER — LANCETS MISC. MISC
1.0000 | Freq: Three times a day (TID) | 0 refills | Status: AC
Start: 2024-05-01 — End: 2024-05-31

## 2024-05-01 MED ORDER — LANCET DEVICE MISC
1.0000 | Freq: Three times a day (TID) | 0 refills | Status: AC
Start: 2024-05-01 — End: 2024-05-31

## 2024-05-01 MED ORDER — BLOOD GLUCOSE MONITORING SUPPL DEVI: 1.0000 | Freq: Three times a day (TID) | 0 refills | Status: AC

## 2024-05-01 MED ORDER — BLOOD GLUCOSE TEST VI STRP: 1.0000 | ORAL_STRIP | Freq: Three times a day (TID) | 0 refills | Status: DC

## 2024-05-02 ENCOUNTER — Telehealth: Payer: Self-pay

## 2024-05-02 NOTE — Progress Notes (Signed)
 Care Guide Pharmacy Note  05/02/2024 Name: Barbara Hinton MRN: [id number] DOB: 06-14-1971  Referred By: Lavell Bari LABOR, FNP Reason for referral: Complex Care Management (Outreach to schedule with Pharm d )   Barbara Hinton is a 53 y.o. year old female who is a primary care patient of Lavell Bari LABOR, FNP.  Barbara Hinton was referred to the pharmacist for assistance related to: DMII  Successful contact was made with the patient to discuss pharmacy services including being ready for the pharmacist to call at least 5 minutes before the scheduled appointment time and to have medication bottles and any blood pressure readings ready for review. The patient agreed to meet with the pharmacist via telephone visit on (date/time).05/21/2024  Jeoffrey Buffalo , RMA     [place]  [facility], [facility] Guide  Direct Dial: [phone]  Website: [email or web address]

## 2024-05-05 ENCOUNTER — Other Ambulatory Visit: Payer: Self-pay | Admitting: Family

## 2024-05-05 DIAGNOSIS — J45909 Unspecified asthma, uncomplicated: Secondary | ICD-10-CM

## 2024-05-08 ENCOUNTER — Other Ambulatory Visit: Payer: Self-pay | Admitting: Family

## 2024-05-08 DIAGNOSIS — K760 Fatty (change of) liver, not elsewhere classified: Secondary | ICD-10-CM | POA: Insufficient documentation

## 2024-05-21 ENCOUNTER — Other Ambulatory Visit

## 2024-05-31 ENCOUNTER — Other Ambulatory Visit: Payer: Self-pay | Admitting: Family

## 2024-05-31 DIAGNOSIS — R7303 Prediabetes: Secondary | ICD-10-CM

## 2024-06-11 ENCOUNTER — Other Ambulatory Visit

## 2024-06-18 ENCOUNTER — Other Ambulatory Visit

## 2024-06-18 DIAGNOSIS — R931 Abnormal findings on diagnostic imaging of heart and coronary circulation: Secondary | ICD-10-CM

## 2024-06-18 DIAGNOSIS — Z79899 Other long term (current) drug therapy: Secondary | ICD-10-CM

## 2024-06-19 ENCOUNTER — Ambulatory Visit: Payer: Self-pay | Admitting: Cardiology

## 2024-06-19 LAB — LIPID PANEL
Chol/HDL Ratio: 2.9 ratio (ref 0.0–4.4)
Cholesterol, Total: 118 mg/dL (ref 100–199)
HDL: 41 mg/dL (ref >39)
LDL Chol Calc (NIH): 57 mg/dL (ref 0–99)
Triglycerides: 109 mg/dL (ref 0–149)
VLDL Cholesterol Cal: 20 mg/dL (ref 5–40)

## 2024-06-19 LAB — ALT: ALT: 37 IU/L — ABNORMAL HIGH (ref 0–32)

## 2024-06-23 ENCOUNTER — Ambulatory Visit: Admitting: Gastroenterology

## 2024-06-30 ENCOUNTER — Ambulatory Visit: Admitting: Gastroenterology

## 2024-06-30 ENCOUNTER — Encounter: Payer: Self-pay | Admitting: Gastroenterology

## 2024-06-30 VITALS — BP 115/82 | HR 92 | Temp 98.2°F | Ht 64.0 in | Wt 316.8 lb

## 2024-06-30 DIAGNOSIS — R7989 Other specified abnormal findings of blood chemistry: Secondary | ICD-10-CM

## 2024-06-30 DIAGNOSIS — R1013 Epigastric pain: Secondary | ICD-10-CM

## 2024-06-30 DIAGNOSIS — Z1211 Encounter for screening for malignant neoplasm of colon: Secondary | ICD-10-CM

## 2024-06-30 DIAGNOSIS — R42 Dizziness and giddiness: Secondary | ICD-10-CM | POA: Diagnosis not present

## 2024-06-30 DIAGNOSIS — K219 Gastro-esophageal reflux disease without esophagitis: Secondary | ICD-10-CM | POA: Diagnosis not present

## 2024-06-30 DIAGNOSIS — K5900 Constipation, unspecified: Secondary | ICD-10-CM

## 2024-06-30 DIAGNOSIS — K760 Fatty (change of) liver, not elsewhere classified: Secondary | ICD-10-CM

## 2024-06-30 NOTE — H&P (View-Only) (Signed)
 GI Office Note    Referring Provider: Lavell Bari LABOR, FNP Primary Care Physician:  Lavell Bari LABOR, FNP Primary Gastroenterologist: Carlin POUR. Cindie, DO  Date:  06/30/2024  ID:  Barbara Hinton, DOB 05-20-1971, MRN [id number]   Chief Complaint   Chief Complaint  Patient presents with   Follow-up    Doing well, no issues   History of Present Illness  Barbara Hinton is a 53 y.o. female with a history of GERD, dizziness s/p ED visit x 2 with concern for vertigo, asthma, heart palpitations previously evaluated by cardiology, OSA, constipation, fatty liver presenting today with no complaints. .  Office visit 04/11/2022.  Reported difficulty completing Cologuard due to finding a [employer] facility to send sample.  Having nausea once or twice per week, taking Protonix  daily.  Frequent belching with eating.  Dr Nunzio makes her sick.  Drinks water and tea in the mornings and is cut out spicy foods and that seems to have improved her reflux.  Working on diet changes.  Occasionally feel if she could burp, feeling air stuck in her chest at times.  Doing okay with constipation and having at least 1 BM daily, sometimes 2.  Cut back on fiber.  Some mild incomplete emptying.  Abdominal pain improved since she stopped lifting heavy things at work.  Given history of fatty liver and mildly elevated liver enzymes, HFP checked.  Advised to continue PPI daily, Gas-X as needed for belching, continue fiber daily.  If incomplete emptying lower abdominal discomfort she may take MiraLAX.  Advised to complete Cologuard and follow-up in 6 months or sooner if needed.   Lab 04/12/2022 with normal LFTs. Cologuard not yet completed   OV 12/07/22 performed virtually.  Reflux reportedly controlled with pantoprazole  once daily although does forget at times.  Denies any significant breakthrough symptoms nausea or vomiting.  Had a good appetite but did report some occasional belching and excessive gas.  Sometimes will use Tums and  that will give her relief.  Denied any dysphagia.  Did report some dizziness, cough, and was having some heart palpitations and was undergoing evaluation with cardiology.  Reported bowel movement daily, sometimes twice daily denied any abdominal pain.  Advise continue pantoprazole  40 mg daily and monitor for recurrence of chest pain.  Advised famotidine once daily if she felt like she was having side effects from pantoprazole .  Advised on weight loss and completion of Cologuard.  Follow low-fat/low-cholesterol diet.   ED visit 03/24/2024 for chest pain.  She also had reported some lightheadedness and any actual palpitations.  Also noted a headache for 2-3 days.  EKG with normal sinus rhythm.  No prolonged QRS.  Troponins unremarkable.  Physical exam benign per EDP.   ED visit to [facility] 04/10/2024.  Reported dizziness.  Discussed with her that they felt as though this was BPPV or paroxysmal vertigo.  Her vital signs were stable.  EKG was normal.  She did admit to nausea at times but no symptoms the stool exam reported a palpable hernia that was nontender to palpation with scant swelling to the bilateral lower extremities.  She was given Maalox and Pepcid for acid reflux.  She is also given meclizine and IV fluids. Labs normal.   Coronary CTA 04/17/2024: - Coronary artery calcium  score 0.5.  Hide risk for future cardiac events. - No obstructive CAD  Last office visit 04/23/24.  Having dizzy spells and tingling in her arms and reports positive test in the ER.  She noticed the hernia midline was getting worse and sometimes having pain along the right and left side of that region.  Having burning in the epigastric pain that was intense for several minutes and then goes away.  Pepcid helped in the ED visit but that pain has not happened again since then.  Has some occasional nausea, trying to eat more fruits and cut out sugary drinks and only drinking diet drinks when she wants this and has tried to increase  her water intake.  Was not eating when she had any of this pain previously.  Has stopped taking her pantoprazole  secondary to headaches and only taking famotidine as needed.  Sometimes having to strain with bowel movements but usually goes every day, sometimes twice daily.  Has taken MiraLAX in the past but not often.  Taking meclizine for dizzy spells.  I have asked her to continue famotidine and encouraged 20 mg daily instead of as needed and to continue to hold her pantoprazole  and follow GERD diet.  Advised her to monitor her hernia pain for any changes in bowel habits and talk to surgery regarding this if it occurs.  Start Metamucil 1 tablespoon daily and MiraLAX daily if Metamucil not helpful.  Advised her to continue meclizine as needed for dizziness.  Today:  Discussed the use of AI scribe software for clinical note transcription with the patient, who gave verbal consent to proceed.  She has experienced significant improvement in her gastroesophageal reflux disease symptoms since her last visit. The use of a wedge pillow while sleeping has effectively reduced her heartburn, and she rarely requires famotidine or other antacids. She reports no nausea or upper abdominal pain. Occasional heartburn occurs, particularly after consuming spicy foods, tomatoes, or eating late at night. She uses Pepcid twice a week, especially when consuming foods that trigger her symptoms, and finds relief from using a wedge pillow while sleeping. Does have some occasional dysphagia   She expresses concern about a recent scan indicating chronic lung disease or small airway narrowing. Despite this, she reports no significant respiratory issues beyond those attributed to being overweight. She does not smoke and denies wheezing.  Her medical history includes elevated liver enzymes and hepatic steatosis. She is concerned about her brother's history of liver transplant due to cirrhosis. She monitors her blood sugar regularly and  notes fluctuations, with a past episode of low blood sugar (72 mg/dL) after consuming a sugary drink/meal. She had associated dizziness with this.   She has a history of dizziness and nausea, which have improved. She carries meclizine for motion sickness but has not needed it recently. An ENT appointment is scheduled in October to further evaluate her dizziness.  She has a history of a hernia, which does not currently cause pain as she avoids heavy lifting at work. She has stopped taking metoprolol , and her blood pressure remains stable without medication.  She has not had a colonoscopy yet, despite being 53 years old, and has no family history of colon cancer or polyps. She has received a Cologuard test but has not completed it yet.      Wt Readings from Last 5 Encounters:  06/30/24 (!) 316 lb 12.8 oz (143.7 kg)  04/28/24 (!) 320 lb 6.4 oz (145.3 kg)  04/23/24 (!) 320 lb 9.6 oz (145.4 kg)  04/02/24 (!) 320 lb (145.2 kg)  03/24/24 (!) 321 lb (145.6 kg)   Current Outpatient Medications  Medication Sig Dispense Refill   albuterol  (VENTOLIN  HFA) 108 (90 Base)  MCG/ACT inhaler INHALE 2 PUFFS INTO THE LUNGS EVERY 6 HOURS AS NEEDED FOR WHEEZE OR SHORTNESS OF BREATH 6.7 each 2   aspirin 81 MG tablet Take 81 mg by mouth 2 (two) times a week.     atorvastatin  (LIPITOR) 10 MG tablet Take 1 tablet (10 mg total) by mouth daily. 90 tablet 3   betamethasone  dipropionate 0.05 % cream Apply topically 2 (two) times daily. 30 g 0   Blood Glucose Monitoring Suppl DEVI 1 each by Does not apply route in the morning, at noon, and at bedtime. May substitute to any manufacturer covered by patient's insurance. 1 each 0   calcipotriene  (DOVONOX) 0.005 % ointment Apply topically 2 (two) times daily. 60 g 0   famotidine (PEPCID) 20 MG tablet Take 20 mg by mouth 2 (two) times daily.     fluticasone -salmeterol (WIXELA INHUB) 100-50 MCG/ACT AEPB INHALE 1 PUFF INTO THE LUNGS TWICE A DAY 60 each 2   glucose blood (ACCU-CHEK  GUIDE TEST) test strip Test BS in the morning, noon and at bedtime Dx R73.03 300 strip 3   meclizine (ANTIVERT) 12.5 MG tablet Take 12.5 mg by mouth 3 (three) times daily as needed for dizziness (as needed onset of dizzy).     metoprolol  succinate (TOPROL  XL) 25 MG 24 hr tablet Take 0.5 tablets (12.5 mg total) by mouth daily. 45 tablet 3   No current facility-administered medications for this visit.    Past Medical History:  Diagnosis Date   Agatston coronary artery calcium  score less than 100 04/2024   cor cal score 0.5   Asthma    OSA (obstructive sleep apnea) 12/04/2017   Mild with AHI overall 9.8/hr but during REM sleep severe with AHI 53.8/hr and nocturnal hypoxemia now on CPAP at 10cm H2O.     PVC (premature ventricular contraction)     Past Surgical History:  Procedure Laterality Date   CESAREAN SECTION      Family History  Problem Relation Age of Onset   Diabetes Mother    Diabetes Father    Hypertension Father    Cirrhosis Brother        liver transplant   Hypertension Brother    Heart attack Maternal Grandfather    Heart attack Paternal Grandfather    Colon cancer Neg Hx    Colon polyps Neg Hx     Allergies as of 06/30/2024   (No Known Allergies)    Social History   Socioeconomic History   Marital status: Married    Spouse name: Not on file   Number of children: Not on file   Years of education: Not on file   Highest education level: 12th grade  Occupational History   Not on file  Tobacco Use   Smoking status: Never    Passive exposure: Yes   Smokeless tobacco: Never  Substance and Sexual Activity   Alcohol use: No   Drug use: No   Sexual activity: Not on file  Other Topics Concern   Not on file  Social History Narrative   Not on file   Social Drivers of Health   Financial Resource Strain: Patient Declined (04/25/2024)   Overall Financial Resource Strain (CARDIA)    Difficulty of Paying Living Expenses: Patient declined  Food Insecurity:  Patient Declined (04/25/2024)   Hunger Vital Sign    Worried About Running Out of Food in the Last Year: Patient declined    Ran Out of Food in the Last Year: Patient declined  Transportation Needs: No Transportation Needs (04/25/2024)   PRAPARE - Administrator, Civil Service (Medical): No    Lack of Transportation (Non-Medical): No  Physical Activity: Inactive (04/25/2024)   Exercise Vital Sign    Days of Exercise per Week: 1 day    Minutes of Exercise per Session: 0 min  Stress: Stress Concern Present (04/25/2024)   [employer] of Occupational Health - Occupational Stress Questionnaire    Feeling of Stress: To some extent  Social Connections: Unknown (04/25/2024)   Social Connection and Isolation Panel    Frequency of Communication with Friends and Family: Patient declined    Frequency of Social Gatherings with Friends and Family: More than three times a week    Attends Religious Services: 1 to 4 times per year    Active Member of [employer] or Organizations: Patient declined    Attends Engineer, structural: Not on file    Marital Status: Married   Review of Systems   Gen: Denies fever, chills, anorexia. Denies fatigue, weakness, weight loss.  CV: Denies chest pain, palpitations, syncope, peripheral edema, and claudication. Resp: Denies dyspnea at rest, cough, wheezing, coughing up blood, and pleurisy. GI: See HPI Derm: Denies rash, itching, dry skin Psych: Denies depression, anxiety, memory loss, confusion. No homicidal or suicidal ideation.  Heme: Denies bruising, bleeding, and enlarged lymph nodes.  Physical Exam   BP 115/82 (BP Location: Right Arm, Patient Position: Sitting, Cuff Size: Large)   Pulse 92   Temp 98.2 F (36.8 C) (Oral)   Ht 5' 4 (1.626 m)   Wt (!) 316 lb 12.8 oz (143.7 kg)   LMP  (LMP Unknown)   SpO2 94%   BMI 54.38 kg/m   General:   Alert and oriented. No distress noted. Pleasant and cooperative.  Head:  Normocephalic and  atraumatic. Eyes:  Conjuctiva clear without scleral icterus. Mouth:  Oral mucosa pink and moist. Good dentition. No lesions. Lungs:  Clear to auscultation bilaterally. No wheezes, rales, or rhonchi. No distress.  Heart:  S1, S2 present without murmurs appreciated.  Abdomen:  +BS, soft, non-tender and non-distended, rounded. . No rebound or guarding. UTA HSM.  Rectal: deferred Msk:  Symmetrical without gross deformities. Normal posture. Extremities:  Without edema. Neurologic:  Alert and  oriented x4 Psych:  Alert and cooperative. Normal mood and affect.  Assessment & Plan  LENNIX ROTUNDO is a 53 y.o. female presenting today with no complaints.      Screening for colon cancer 53 year old female has not undergone colonoscopy. No family history of colon cancer or polyps. Colonoscopy is the gold standard for screening, with Cologuard as an alternative. Cologuard has false positives and negatives, whereas colonoscopy provides direct visualization. She has logistical issues with completing Cologuard. Discussed scheduling colonoscopy, potentially combined with upper endoscopy due to GERD and dysphagia. Risks/benefits discussed.  - Schedule colonoscopy at [facility].  - Ensure awareness of prep process and potential need for repeat if not clean.  Gastroesophageal reflux disease (GERD) with intermittent dysphagia GERD symptoms improved with lifestyle modifications, including wedge pillow use. Intermittent dysphagia possibly related to GERD, esophagitis, esophageal web, stricture, ring, or other stenosis. Chronic reflux can cause esophageal cell changes. Symptoms triggered by spicy and tomato-based foods. Preemptive use of Pepcid before consuming trigger foods discussed. - Continue lifestyle modifications including wedge pillow use. - Use Pepcid preemptively before consuming trigger foods.  Hepatic steatosis (fatty liver) with elevated liver enzymes, likely metabolic related Hepatic steatosis  likely related to  metabolic factors such as elevated blood sugars and cholesterol. Risk of progression to cirrhosis if not managed. Emphasized diet and lifestyle modifications to reduce liver fat and inflammation. Potential for new medication to reverse fibrosis if criteria are met. - Order basic labs to monitor liver fibrosis (ELF, Fibrosure, CBC, CMP, INR, TSH, Fib-4) - Mediterranean diet recommended.   Constipation Bowel movements improved with regular daily movements and reduced straining. Not currently on any any fiber supplements - Consider psyllium or metamucil supplements.   Dizziness/vertigo, recurrent Dizziness and vertigo improved. No longer taking meclizine regularly but carries it for potential episodes. ENT referral scheduled for October to rule out inner ear causes. - Keep meclizine on hand for potential dizziness episodes. - Attend ENT appointment in October.      Proceed with upper endoscopy with dilation and colonoscopy with propofol by Dr. Cindie  in near future: the risks, benefits, and alternatives have been discussed with the patient in detail. The patient states understanding and desires to proceed. ASA 3 (BMI 54)   Follow up   Follow up 6 months.    Charmaine Melia, MSN, FNP-BC, AGACNP-BC [facility]

## 2024-06-30 NOTE — Progress Notes (Signed)
 GI Office Note    Referring Provider: Lavell Bari LABOR, FNP Primary Care Physician:  Lavell Bari LABOR, FNP Primary Gastroenterologist: Carlin POUR. Cindie, DO  Date:  06/30/2024  ID:  Barbara Hinton, DOB 05-20-1971, MRN [id number]   Chief Complaint   Chief Complaint  Patient presents with   Follow-up    Doing well, no issues   History of Present Illness  Barbara Hinton is a 53 y.o. female with a history of GERD, dizziness s/p ED visit x 2 with concern for vertigo, asthma, heart palpitations previously evaluated by cardiology, OSA, constipation, fatty liver presenting today with no complaints. .  Office visit 04/11/2022.  Reported difficulty completing Cologuard due to finding a [employer] facility to send sample.  Having nausea once or twice per week, taking Protonix  daily.  Frequent belching with eating.  Dr Nunzio makes her sick.  Drinks water and tea in the mornings and is cut out spicy foods and that seems to have improved her reflux.  Working on diet changes.  Occasionally feel if she could burp, feeling air stuck in her chest at times.  Doing okay with constipation and having at least 1 BM daily, sometimes 2.  Cut back on fiber.  Some mild incomplete emptying.  Abdominal pain improved since she stopped lifting heavy things at work.  Given history of fatty liver and mildly elevated liver enzymes, HFP checked.  Advised to continue PPI daily, Gas-X as needed for belching, continue fiber daily.  If incomplete emptying lower abdominal discomfort she may take MiraLAX.  Advised to complete Cologuard and follow-up in 6 months or sooner if needed.   Lab 04/12/2022 with normal LFTs. Cologuard not yet completed   OV 12/07/22 performed virtually.  Reflux reportedly controlled with pantoprazole  once daily although does forget at times.  Denies any significant breakthrough symptoms nausea or vomiting.  Had a good appetite but did report some occasional belching and excessive gas.  Sometimes will use Tums and  that will give her relief.  Denied any dysphagia.  Did report some dizziness, cough, and was having some heart palpitations and was undergoing evaluation with cardiology.  Reported bowel movement daily, sometimes twice daily denied any abdominal pain.  Advise continue pantoprazole  40 mg daily and monitor for recurrence of chest pain.  Advised famotidine once daily if she felt like she was having side effects from pantoprazole .  Advised on weight loss and completion of Cologuard.  Follow low-fat/low-cholesterol diet.   ED visit 03/24/2024 for chest pain.  She also had reported some lightheadedness and any actual palpitations.  Also noted a headache for 2-3 days.  EKG with normal sinus rhythm.  No prolonged QRS.  Troponins unremarkable.  Physical exam benign per EDP.   ED visit to [facility] 04/10/2024.  Reported dizziness.  Discussed with her that they felt as though this was BPPV or paroxysmal vertigo.  Her vital signs were stable.  EKG was normal.  She did admit to nausea at times but no symptoms the stool exam reported a palpable hernia that was nontender to palpation with scant swelling to the bilateral lower extremities.  She was given Maalox and Pepcid for acid reflux.  She is also given meclizine and IV fluids. Labs normal.   Coronary CTA 04/17/2024: - Coronary artery calcium  score 0.5.  Hide risk for future cardiac events. - No obstructive CAD  Last office visit 04/23/24.  Having dizzy spells and tingling in her arms and reports positive test in the ER.  She noticed the hernia midline was getting worse and sometimes having pain along the right and left side of that region.  Having burning in the epigastric pain that was intense for several minutes and then goes away.  Pepcid helped in the ED visit but that pain has not happened again since then.  Has some occasional nausea, trying to eat more fruits and cut out sugary drinks and only drinking diet drinks when she wants this and has tried to increase  her water intake.  Was not eating when she had any of this pain previously.  Has stopped taking her pantoprazole  secondary to headaches and only taking famotidine as needed.  Sometimes having to strain with bowel movements but usually goes every day, sometimes twice daily.  Has taken MiraLAX in the past but not often.  Taking meclizine for dizzy spells.  I have asked her to continue famotidine and encouraged 20 mg daily instead of as needed and to continue to hold her pantoprazole  and follow GERD diet.  Advised her to monitor her hernia pain for any changes in bowel habits and talk to surgery regarding this if it occurs.  Start Metamucil 1 tablespoon daily and MiraLAX daily if Metamucil not helpful.  Advised her to continue meclizine as needed for dizziness.  Today:  Discussed the use of AI scribe software for clinical note transcription with the patient, who gave verbal consent to proceed.  She has experienced significant improvement in her gastroesophageal reflux disease symptoms since her last visit. The use of a wedge pillow while sleeping has effectively reduced her heartburn, and she rarely requires famotidine or other antacids. She reports no nausea or upper abdominal pain. Occasional heartburn occurs, particularly after consuming spicy foods, tomatoes, or eating late at night. She uses Pepcid twice a week, especially when consuming foods that trigger her symptoms, and finds relief from using a wedge pillow while sleeping. Does have some occasional dysphagia   She expresses concern about a recent scan indicating chronic lung disease or small airway narrowing. Despite this, she reports no significant respiratory issues beyond those attributed to being overweight. She does not smoke and denies wheezing.  Her medical history includes elevated liver enzymes and hepatic steatosis. She is concerned about her brother's history of liver transplant due to cirrhosis. She monitors her blood sugar regularly and  notes fluctuations, with a past episode of low blood sugar (72 mg/dL) after consuming a sugary drink/meal. She had associated dizziness with this.   She has a history of dizziness and nausea, which have improved. She carries meclizine for motion sickness but has not needed it recently. An ENT appointment is scheduled in October to further evaluate her dizziness.  She has a history of a hernia, which does not currently cause pain as she avoids heavy lifting at work. She has stopped taking metoprolol , and her blood pressure remains stable without medication.  She has not had a colonoscopy yet, despite being 53 years old, and has no family history of colon cancer or polyps. She has received a Cologuard test but has not completed it yet.      Wt Readings from Last 5 Encounters:  06/30/24 (!) 316 lb 12.8 oz (143.7 kg)  04/28/24 (!) 320 lb 6.4 oz (145.3 kg)  04/23/24 (!) 320 lb 9.6 oz (145.4 kg)  04/02/24 (!) 320 lb (145.2 kg)  03/24/24 (!) 321 lb (145.6 kg)   Current Outpatient Medications  Medication Sig Dispense Refill   albuterol  (VENTOLIN  HFA) 108 (90 Base)  MCG/ACT inhaler INHALE 2 PUFFS INTO THE LUNGS EVERY 6 HOURS AS NEEDED FOR WHEEZE OR SHORTNESS OF BREATH 6.7 each 2   aspirin 81 MG tablet Take 81 mg by mouth 2 (two) times a week.     atorvastatin  (LIPITOR) 10 MG tablet Take 1 tablet (10 mg total) by mouth daily. 90 tablet 3   betamethasone  dipropionate 0.05 % cream Apply topically 2 (two) times daily. 30 g 0   Blood Glucose Monitoring Suppl DEVI 1 each by Does not apply route in the morning, at noon, and at bedtime. May substitute to any manufacturer covered by patient's insurance. 1 each 0   calcipotriene  (DOVONOX) 0.005 % ointment Apply topically 2 (two) times daily. 60 g 0   famotidine (PEPCID) 20 MG tablet Take 20 mg by mouth 2 (two) times daily.     fluticasone -salmeterol (WIXELA INHUB) 100-50 MCG/ACT AEPB INHALE 1 PUFF INTO THE LUNGS TWICE A DAY 60 each 2   glucose blood (ACCU-CHEK  GUIDE TEST) test strip Test BS in the morning, noon and at bedtime Dx R73.03 300 strip 3   meclizine (ANTIVERT) 12.5 MG tablet Take 12.5 mg by mouth 3 (three) times daily as needed for dizziness (as needed onset of dizzy).     metoprolol  succinate (TOPROL  XL) 25 MG 24 hr tablet Take 0.5 tablets (12.5 mg total) by mouth daily. 45 tablet 3   No current facility-administered medications for this visit.    Past Medical History:  Diagnosis Date   Agatston coronary artery calcium  score less than 100 04/2024   cor cal score 0.5   Asthma    OSA (obstructive sleep apnea) 12/04/2017   Mild with AHI overall 9.8/hr but during REM sleep severe with AHI 53.8/hr and nocturnal hypoxemia now on CPAP at 10cm H2O.     PVC (premature ventricular contraction)     Past Surgical History:  Procedure Laterality Date   CESAREAN SECTION      Family History  Problem Relation Age of Onset   Diabetes Mother    Diabetes Father    Hypertension Father    Cirrhosis Brother        liver transplant   Hypertension Brother    Heart attack Maternal Grandfather    Heart attack Paternal Grandfather    Colon cancer Neg Hx    Colon polyps Neg Hx     Allergies as of 06/30/2024   (No Known Allergies)    Social History   Socioeconomic History   Marital status: Married    Spouse name: Not on file   Number of children: Not on file   Years of education: Not on file   Highest education level: 12th grade  Occupational History   Not on file  Tobacco Use   Smoking status: Never    Passive exposure: Yes   Smokeless tobacco: Never  Substance and Sexual Activity   Alcohol use: No   Drug use: No   Sexual activity: Not on file  Other Topics Concern   Not on file  Social History Narrative   Not on file   Social Drivers of Health   Financial Resource Strain: Patient Declined (04/25/2024)   Overall Financial Resource Strain (CARDIA)    Difficulty of Paying Living Expenses: Patient declined  Food Insecurity:  Patient Declined (04/25/2024)   Hunger Vital Sign    Worried About Running Out of Food in the Last Year: Patient declined    Ran Out of Food in the Last Year: Patient declined  Transportation Needs: No Transportation Needs (04/25/2024)   PRAPARE - Administrator, Civil Service (Medical): No    Lack of Transportation (Non-Medical): No  Physical Activity: Inactive (04/25/2024)   Exercise Vital Sign    Days of Exercise per Week: 1 day    Minutes of Exercise per Session: 0 min  Stress: Stress Concern Present (04/25/2024)   [employer] of Occupational Health - Occupational Stress Questionnaire    Feeling of Stress: To some extent  Social Connections: Unknown (04/25/2024)   Social Connection and Isolation Panel    Frequency of Communication with Friends and Family: Patient declined    Frequency of Social Gatherings with Friends and Family: More than three times a week    Attends Religious Services: 1 to 4 times per year    Active Member of [employer] or Organizations: Patient declined    Attends Engineer, structural: Not on file    Marital Status: Married   Review of Systems   Gen: Denies fever, chills, anorexia. Denies fatigue, weakness, weight loss.  CV: Denies chest pain, palpitations, syncope, peripheral edema, and claudication. Resp: Denies dyspnea at rest, cough, wheezing, coughing up blood, and pleurisy. GI: See HPI Derm: Denies rash, itching, dry skin Psych: Denies depression, anxiety, memory loss, confusion. No homicidal or suicidal ideation.  Heme: Denies bruising, bleeding, and enlarged lymph nodes.  Physical Exam   BP 115/82 (BP Location: Right Arm, Patient Position: Sitting, Cuff Size: Large)   Pulse 92   Temp 98.2 F (36.8 C) (Oral)   Ht 5' 4 (1.626 m)   Wt (!) 316 lb 12.8 oz (143.7 kg)   LMP  (LMP Unknown)   SpO2 94%   BMI 54.38 kg/m   General:   Alert and oriented. No distress noted. Pleasant and cooperative.  Head:  Normocephalic and  atraumatic. Eyes:  Conjuctiva clear without scleral icterus. Mouth:  Oral mucosa pink and moist. Good dentition. No lesions. Lungs:  Clear to auscultation bilaterally. No wheezes, rales, or rhonchi. No distress.  Heart:  S1, S2 present without murmurs appreciated.  Abdomen:  +BS, soft, non-tender and non-distended, rounded. . No rebound or guarding. UTA HSM.  Rectal: deferred Msk:  Symmetrical without gross deformities. Normal posture. Extremities:  Without edema. Neurologic:  Alert and  oriented x4 Psych:  Alert and cooperative. Normal mood and affect.  Assessment & Plan  Barbara Hinton is a 53 y.o. female presenting today with no complaints.      Screening for colon cancer 53 year old female has not undergone colonoscopy. No family history of colon cancer or polyps. Colonoscopy is the gold standard for screening, with Cologuard as an alternative. Cologuard has false positives and negatives, whereas colonoscopy provides direct visualization. She has logistical issues with completing Cologuard. Discussed scheduling colonoscopy, potentially combined with upper endoscopy due to GERD and dysphagia. Risks/benefits discussed.  - Schedule colonoscopy at [facility].  - Ensure awareness of prep process and potential need for repeat if not clean.  Gastroesophageal reflux disease (GERD) with intermittent dysphagia GERD symptoms improved with lifestyle modifications, including wedge pillow use. Intermittent dysphagia possibly related to GERD, esophagitis, esophageal web, stricture, ring, or other stenosis. Chronic reflux can cause esophageal cell changes. Symptoms triggered by spicy and tomato-based foods. Preemptive use of Pepcid before consuming trigger foods discussed. - Continue lifestyle modifications including wedge pillow use. - Use Pepcid preemptively before consuming trigger foods.  Hepatic steatosis (fatty liver) with elevated liver enzymes, likely metabolic related Hepatic steatosis  likely related to  metabolic factors such as elevated blood sugars and cholesterol. Risk of progression to cirrhosis if not managed. Emphasized diet and lifestyle modifications to reduce liver fat and inflammation. Potential for new medication to reverse fibrosis if criteria are met. - Order basic labs to monitor liver fibrosis (ELF, Fibrosure, CBC, CMP, INR, TSH, Fib-4) - Mediterranean diet recommended.   Constipation Bowel movements improved with regular daily movements and reduced straining. Not currently on any any fiber supplements - Consider psyllium or metamucil supplements.   Dizziness/vertigo, recurrent Dizziness and vertigo improved. No longer taking meclizine regularly but carries it for potential episodes. ENT referral scheduled for October to rule out inner ear causes. - Keep meclizine on hand for potential dizziness episodes. - Attend ENT appointment in October.      Proceed with upper endoscopy with dilation and colonoscopy with propofol by Dr. Cindie  in near future: the risks, benefits, and alternatives have been discussed with the patient in detail. The patient states understanding and desires to proceed. ASA 3 (BMI 54)   Follow up   Follow up 6 months.    Charmaine Melia, MSN, FNP-BC, AGACNP-BC [facility]

## 2024-06-30 NOTE — Patient Instructions (Addendum)
 VISIT SUMMARY: During your visit, we discussed the improvement in your gastroesophageal reflux disease (GERD) symptoms and addressed your concerns about chronic lung disease and liver health. We also reviewed your dizziness, constipation, and the need for colon cancer screening.  YOUR PLAN: -SCREENING FOR COLON CANCER: You have not yet had a colonoscopy, which is the best method for colon cancer screening. We discussed scheduling a colonoscopy, possibly combined with an upper endoscopy to check for any issues related to your GERD. The risks of colonoscopy are very low, but it is important to follow the preparation instructions carefully.  -GASTROESOPHAGEAL REFLUX DISEASE (GERD) WITH INTERMITTENT DYSPHAGIA: GERD is a condition where stomach acid frequently flows back into the tube connecting your mouth and stomach, causing heartburn. Your symptoms have improved with lifestyle changes, but you still experience occasional heartburn and difficulty swallowing. Continue using a wedge pillow and take Pepcid before eating foods that trigger your symptoms. We will schedule an upper endoscopy to check for any changes in your esophagus.  Follow a GERD diet:  Avoid fried, fatty, greasy, spicy, citrus foods. Avoid caffeine and carbonated beverages. Avoid chocolate. Try eating 4-6 small meals a day rather than 3 large meals. Do not eat within 3 hours of laying down. Prop head of bed up on wood or bricks to create a 6 inch incline.  -HEPATIC STEATOSIS (FATTY LIVER) WITH ELEVATED LIVER ENZYMES: Fatty liver disease is when fat builds up in your liver, which can lead to liver damage if not managed. This is often related to high blood sugar and cholesterol levels. We discussed the importance of diet and lifestyle changes to reduce liver fat and inflammation. We will also order labs to monitor your liver health. I typically recommend mediterranean diet for liver health - I attached a handout to your paperwork.    Please have blood work completed at [facility].  We will call you with results once they have been received. Please allow 3-5 business days for review. Please complete these fasting!! 2 locations for [facility] in [place]:              1. [address], [place]              2. [address] [place]    -CONSTIPATION: Your bowel movements have improved, and you are experiencing less straining. Continue with your current routine to maintain regular bowel movements. Psyllium (metamucil) is good for colon health and constipation. Recommend daily fiber amount is 25-30g and this can help you reach that goal .   -DIZZINESS/VERTIGO, RECURRENT: Your dizziness and vertigo have improved, and you are no longer taking meclizine regularly. However, keep it on hand in case of future episodes. You have an ENT appointment scheduled in October to further evaluate the cause of your dizziness.  INSTRUCTIONS:  1. Schedule a colonoscopy and upper endoscopy at [facility] - we will reach out to you to get your scheduled.  2. Continue using a wedge pillow and take Pepcid before consuming trigger foods. 3. Follow the diet and lifestyle recommendations to manage your liver health. 4. Keep meclizine on hand for potential dizziness episodes. 5. Attend your ENT appointment in October.  Follow up in 6 months, sooner if needed.   It was a pleasure to see you today. I want to create trusting relationships with patients. If you receive a survey regarding your visit,  I greatly appreciate you taking time to fill this out on paper or through your MyChart.  I value your feedback.  Charmaine Melia, MSN, FNP-BC, AGACNP-BC [facility]

## 2024-07-01 ENCOUNTER — Encounter: Payer: Self-pay | Admitting: *Deleted

## 2024-07-01 ENCOUNTER — Other Ambulatory Visit: Payer: Self-pay | Admitting: *Deleted

## 2024-07-01 MED ORDER — PEG 3350-KCL-NA BICARB-NACL 420 G PO SOLR: 4000.0000 mL | Freq: Once | ORAL | 0 refills | Status: AC

## 2024-07-03 ENCOUNTER — Other Ambulatory Visit

## 2024-07-04 LAB — FIB-4
ALT: 39 IU/L — ABNORMAL HIGH (ref 0–32)
AST: 43 IU/L — ABNORMAL HIGH (ref 0–40)
FIB-4 Index: 1.42 (ref 0.00–2.67)
Platelets: 247 x10E3/uL (ref 150–450)

## 2024-07-04 LAB — NASH FIBROSURE(R) PLUS

## 2024-07-05 LAB — CBC
Hematocrit: 43.7 % (ref 34.0–46.6)
Hemoglobin: 14.4 g/dL (ref 11.1–15.9)
MCH: 29.5 pg (ref 26.6–33.0)
MCHC: 33 g/dL (ref 31.5–35.7)
MCV: 90 fL (ref 79–97)
Platelets: 256 x10E3/uL (ref 150–450)
RBC: 4.88 x10E6/uL (ref 3.77–5.28)
RDW: 13.4 % (ref 11.7–15.4)
WBC: 6.8 x10E3/uL (ref 3.4–10.8)

## 2024-07-05 LAB — NASH FIBROSURE(R) PLUS
ALPHA 2-MACROGLOBULINS, QN: 192 mg/dL (ref 110–276)
ALT (SGPT) P5P: 43 IU/L — ABNORMAL HIGH (ref 0–40)
AST (SGOT) P5P: 49 IU/L — ABNORMAL HIGH (ref 0–40)
Apolipoprotein A-1: 139 mg/dL (ref 116–209)
Bilirubin, Total: 0.7 mg/dL (ref 0.0–1.2)
Cholesterol, Total: 126 mg/dL (ref 100–199)
Fibrosis Score: 0.2 (ref 0.00–0.21)
GGT: 25 IU/L (ref 0–60)
Glucose: 142 mg/dL — ABNORMAL HIGH (ref 70–99)
Haptoglobin: 135 mg/dL (ref 33–346)
NASH Score: 0.72 — ABNORMAL HIGH (ref 0.00–0.25)
Steatosis Score: 0.59 — ABNORMAL HIGH (ref 0.00–0.40)
Triglycerides: 107 mg/dL (ref 0–149)

## 2024-07-05 LAB — COMPREHENSIVE METABOLIC PANEL WITH GFR
ALT: 39 IU/L — ABNORMAL HIGH (ref 0–32)
AST: 42 IU/L — ABNORMAL HIGH (ref 0–40)
Albumin: 4 g/dL (ref 3.8–4.9)
Alkaline Phosphatase: 105 IU/L (ref 44–121)
BUN/Creatinine Ratio: 13 (ref 9–23)
BUN: 11 mg/dL (ref 6–24)
Bilirubin Total: 0.9 mg/dL (ref 0.0–1.2)
CO2: 22 mmol/L (ref 20–29)
Calcium: 9.4 mg/dL (ref 8.7–10.2)
Chloride: 103 mmol/L (ref 96–106)
Creatinine, Ser: 0.84 mg/dL (ref 0.57–1.00)
Globulin, Total: 3 g/dL (ref 1.5–4.5)
Glucose: 136 mg/dL — ABNORMAL HIGH (ref 70–99)
Potassium: 4.5 mmol/L (ref 3.5–5.2)
Sodium: 140 mmol/L (ref 134–144)
Total Protein: 7 g/dL (ref 6.0–8.5)
eGFR: 84 mL/min/1.73 (ref >59)

## 2024-07-05 LAB — TSH+FREE T4
Free T4: 1.2 ng/dL (ref 0.82–1.77)
TSH: 2.65 u[IU]/mL (ref 0.450–4.500)

## 2024-07-05 LAB — ENHANCED LIVER FIBROSIS (ELF): ELF(TM) Score: 10.63 — ABNORMAL HIGH (ref <9.80)

## 2024-07-15 ENCOUNTER — Ambulatory Visit: Payer: Self-pay | Admitting: Gastroenterology

## 2024-07-16 ENCOUNTER — Other Ambulatory Visit: Payer: Self-pay | Admitting: *Deleted

## 2024-07-16 DIAGNOSIS — K760 Fatty (change of) liver, not elsewhere classified: Secondary | ICD-10-CM

## 2024-07-23 ENCOUNTER — Other Ambulatory Visit (HOSPITAL_COMMUNITY)

## 2024-07-24 ENCOUNTER — Encounter (HOSPITAL_COMMUNITY): Payer: Self-pay

## 2024-07-24 ENCOUNTER — Ambulatory Visit (HOSPITAL_COMMUNITY)
Admission: RE | Admit: 2024-07-24 | Discharge: 2024-07-24 | Disposition: A | Discharge status: Home / Self Care | Source: Ambulatory Visit | Attending: Gastroenterology | Admitting: Gastroenterology

## 2024-07-24 ENCOUNTER — Encounter (HOSPITAL_COMMUNITY)
Admission: RE | Admit: 2024-07-24 | Discharge: 2024-07-24 | Disposition: A | Discharge status: Home / Self Care | Source: Ambulatory Visit | Attending: Internal Medicine | Admitting: Internal Medicine

## 2024-07-24 ENCOUNTER — Other Ambulatory Visit: Payer: Self-pay

## 2024-07-24 VITALS — Ht 64.0 in | Wt 316.8 lb

## 2024-07-24 DIAGNOSIS — K760 Fatty (change of) liver, not elsewhere classified: Secondary | ICD-10-CM | POA: Insufficient documentation

## 2024-07-24 DIAGNOSIS — Z01818 Encounter for other preprocedural examination: Secondary | ICD-10-CM

## 2024-07-24 NOTE — Patient Instructions (Signed)
 [facility]  07/24/2024     @PREFPERIOPPHARMACY @   Your procedure is scheduled on  07/28/2024.   Report to [facility] at  0745 A.M.   Call this number if you have problems the morning of surgery:  [phone]  If you experience any cold or flu symptoms such as cough, fever, chills, shortness of breath, etc. between now and your scheduled surgery, please notify us  at the above number.   Remember:  DO NOT take any medications for diabetes the morning of your procedure.      Use your inhaler before you come and bring your rescue inhaler with you.   Follow the diet and prep instructions given to you by the office.   You may drink clear liquids until 0545 am on 07/28/2024.   Clear liquids allowed are:                    Water, Juice (No red color; non-citric and without pulp; diabetics please choose diet or no sugar options), Carbonated beverages (diabetics please choose diet or no sugar options), Clear Tea (No creamer, milk, or cream, including half & half and powdered creamer), Black Coffee Only (No creamer, milk or cream, including half & half and powdered creamer), and Clear Sports drink (No red color; diabetics please choose diet or no sugar options)    Take these medicines the morning of surgery with A SIP OF WATER                                           metoprolol , pepcid.    Do not wear jewelry, make-up or nail polish, including gel polish,  artificial nails, or any other type of covering on natural nails (fingers and  toes).  Do not wear lotions, powders, or perfumes, or deodorant.  Do not shave 48 hours prior to surgery.  Men may shave face and neck.  Do not bring valuables to the hospital.  [facility] is not responsible for any belongings or valuables.  Contacts, dentures or bridgework may not be worn into surgery.  Leave your suitcase in the car.  After surgery it may be brought to your room.  For patients admitted to the hospital, discharge time  will be determined by your treatment team.  Patients discharged the day of surgery will not be allowed to drive home and must have someone with them for 24 hours.    Special instructions:   DO NOT smoke tobacco or vape for 24 hours before your procedure.  Please read over the following fact sheets that you were given. Anesthesia Post-op Instructions and Care and Recovery After Surgery      Upper Endoscopy, Adult, Care After After the procedure, it is common to have a sore throat. It is also common to have: Mild stomach pain or discomfort. Bloating. Nausea. Follow these instructions at home: The instructions below may help you care for yourself at home. Your health care provider may give you more instructions. If you have questions, ask your health care provider. If you were given a sedative during the procedure, it can affect you for several hours. Do not drive or operate machinery until your health care provider says that it is safe. If you will be going home right after the procedure, plan to have a responsible adult: Take you home  from the hospital or clinic. You will not be allowed to drive. Care for you for the time you are told. Follow instructions from your health care provider about what you may eat and drink. Return to your normal activities as told by your health care provider. Ask your health care provider what activities are safe for you. Take over-the-counter and prescription medicines only as told by your health care provider. Contact a health care provider if you: Have a sore throat that lasts longer than one day. Have trouble swallowing. Have a fever. Get help right away if you: Vomit blood or your vomit looks like coffee grounds. Have bloody, black, or tarry stools. Have a very bad sore throat or you cannot swallow. Have difficulty breathing or very bad pain in your chest or abdomen. These symptoms may be an emergency. Get help right away. Call 911. Do not wait to  see if the symptoms will go away. Do not drive yourself to the hospital. Summary After the procedure, it is common to have a sore throat, mild stomach discomfort, bloating, and nausea. If you were given a sedative during the procedure, it can affect you for several hours. Do not drive until your health care provider says that it is safe. Follow instructions from your health care provider about what you may eat and drink. Return to your normal activities as told by your health care provider. This information is not intended to replace advice given to you by your health care provider. Make sure you discuss any questions you have with your health care provider. Document Revised: 02/01/2022 Document Reviewed: 02/01/2022 Elsevier Patient Education  2024 Elsevier Inc.Esophageal Dilatation Esophageal dilatation, or dilation, is done to stretch a blocked or narrowed part of your esophagus. The esophagus is the part of your body that moves food from your mouth to your stomach. You may need to have it stretched if: You have a lot of scar tissue and it makes it hard or painful to swallow. You have cancer of the esophagus. There's a problem with how food moves through your esophagus. In some cases, you may need to have this procedure done more than once. Tell a health care provider about: Any allergies you have. All medicines you're taking, including vitamins, herbs, eye drops, creams, and over-the-counter medicines. Any problems you or family members have had with anesthesia. Any bleeding problems you have. Any surgeries you've had. Any medical conditions you have. Whether you're pregnant or may be pregnant. What are the risks? Your health care provider will talk with you about risks. These may include: Bleeding. A hole or tear in your esophagus. What happens before the procedure? When to stop eating and drinking Follow instructions from your provider about what you may eat and drink. These may  include: 8 hours before your procedure Stop eating most foods. Do not eat meat, fried foods, or fatty foods. Eat only light foods, such as toast or crackers. All liquids are okay except energy drinks and alcohol. 6 hours before your procedure Stop eating. Drink only clear liquids, such as water, clear fruit juice, black coffee, plain tea, and sports drinks. Do not drink energy drinks or alcohol. 2 hours before your procedure Stop drinking all liquids. You may be allowed to take medicines with small sips of water. If you don't follow your provider's instructions, your procedure may be delayed or canceled. Medicines Ask your provider about: Changing or stopping your regular medicines. These include any diabetes medicines or blood thinners you take.  Taking medicines such as aspirin and ibuprofen . These medicines can thin your blood. Do not take them unless your provider tells you to. Taking over-the-counter medicines, vitamins, herbs, and supplements. General instructions If you'll be going home right after the procedure, plan to have a responsible adult: Take you home from the hospital or clinic. You won't be allowed to drive. Care for you for the time you're told. What happens during the procedure? You may be given: A sedative. This helps you relax. Anesthesia. This keeps you from feeling pain. It will numb certain areas of your body. The stretching may be done with: Simple dilators. These are tools put in your esophagus to stretch it. Guide wires. These wires are put in using a tube called an endoscope. A dilator is put over the wires to stretch your esophagus. Then the wires are taken out. A balloon. The balloon is on the end of a tube. It's inflated to stretch your esophagus. The procedure may vary among providers and hospitals. What can I expect after the procedure? Your blood pressure, heart rate, breathing rate, and blood oxygen level will be monitored until you leave the  hospital or clinic. Your throat may feel sore and numb. This will get better over time. You won't be allowed to eat or drink until your throat is no longer numb. You may be able to go home when you can: Drink. Pee. Sit on the edge of the bed without nausea or dizziness. Follow these instructions at home: Activity If you were given a sedative during the procedure, it can affect you for several hours. Do not drive or operate machinery until your provider says it's safe. Return to your normal activities as told by your provider. Ask your provider what activities are safe for you. General instructions Take over-the-counter and prescription medicines only as told by your provider. Follow instructions from your provider about what you may eat and drink. Do not use any products that contain nicotine or tobacco. These products include cigarettes, chewing tobacco, and vaping devices, such as e-cigarettes. If you need help quitting, ask your provider. Keep all follow-up visits. Your provider will make sure the procedure worked. Where to find more information American Society for Gastrointestinal Endoscopy (ASGE): asge.org Contact a health care provider if: You have trouble swallowing. You have a fever. Your pain doesn't get better with medicine. Get help right away if: You have chest pain. You have trouble breathing. You vomit blood. Your poop is: Black. Tarry. Bloody. These symptoms may be an emergency. Get help right away. Call 911. Do not wait to see if the symptoms will go away. Do not drive yourself to the hospital. This information is not intended to replace advice given to you by your health care provider. Make sure you discuss any questions you have with your health care provider. Document Revised: 01/19/2023 Document Reviewed: 01/19/2023 Elsevier Patient Education  2024 Elsevier Inc.Colonoscopy, Adult, Care After The following information offers guidance on how to care for  yourself after your procedure. Your health care provider may also give you more specific instructions. If you have problems or questions, contact your health care provider. What can I expect after the procedure? After the procedure, it is common to have: A small amount of blood in your stool for 24 hours after the procedure. Some gas. Mild cramping or bloating of your abdomen. Follow these instructions at home: Eating and drinking  Drink enough fluid to keep your urine pale yellow. Follow instructions from your health  care provider about eating or drinking restrictions. Resume your normal diet as told by your health care provider. Avoid heavy or fried foods that are hard to digest. Activity Rest as told by your health care provider. Avoid sitting for a long time without moving. Get up to take short walks every 1-2 hours. This is important to improve blood flow and breathing. Ask for help if you feel weak or unsteady. Return to your normal activities as told by your health care provider. Ask your health care provider what activities are safe for you. Managing cramping and bloating  Try walking around when you have cramps or feel bloated. If directed, apply heat to your abdomen as told by your health care provider. Use the heat source that your health care provider recommends, such as a moist heat pack or a heating pad. Place a towel between your skin and the heat source. Leave the heat on for 20-30 minutes. Remove the heat if your skin turns bright red. This is especially important if you are unable to feel pain, heat, or cold. You have a greater risk of getting burned. General instructions If you were given a sedative during the procedure, it can affect you for several hours. Do not drive or operate machinery until your health care provider says that it is safe. For the first 24 hours after the procedure: Do not sign important documents. Do not drink alcohol. Do your regular daily  activities at a slower pace than normal. Eat soft foods that are easy to digest. Take over-the-counter and prescription medicines only as told by your health care provider. Keep all follow-up visits. This is important. Contact a health care provider if: You have blood in your stool 2-3 days after the procedure. Get help right away if: You have more than a small spotting of blood in your stool. You have large blood clots in your stool. You have swelling of your abdomen. You have nausea or vomiting. You have a fever. You have increasing pain in your abdomen that is not relieved with medicine. These symptoms may be an emergency. Get help right away. Call 911. Do not wait to see if the symptoms will go away. Do not drive yourself to the hospital. Summary After the procedure, it is common to have a small amount of blood in your stool. You may also have mild cramping and bloating of your abdomen. If you were given a sedative during the procedure, it can affect you for several hours. Do not drive or operate machinery until your health care provider says that it is safe. Get help right away if you have a lot of blood in your stool, nausea or vomiting, a fever, or increased pain in your abdomen. This information is not intended to replace advice given to you by your health care provider. Make sure you discuss any questions you have with your health care provider. Document Revised: 12/05/2022 Document Reviewed: 06/15/2021 Elsevier Patient Education  2024 Elsevier Inc.General Anesthesia, Adult, Care After The following information offers guidance on how to care for yourself after your procedure. Your health care provider may also give you more specific instructions. If you have problems or questions, contact your health care provider. What can I expect after the procedure? After the procedure, it is common for people to: Have pain or discomfort at the IV site. Have nausea or vomiting. Have a sore  throat or hoarseness. Have trouble concentrating. Feel cold or chills. Feel weak, sleepy, or tired (fatigue). Have soreness  and body aches. These can affect parts of the body that were not involved in surgery. Follow these instructions at home: For the time period you were told by your health care provider:  Rest. Do not participate in activities where you could fall or become injured. Do not drive or use machinery. Do not drink alcohol. Do not take sleeping pills or medicines that cause drowsiness. Do not make important decisions or sign legal documents. Do not take care of children on your own. General instructions Drink enough fluid to keep your urine pale yellow. If you have sleep apnea, surgery and certain medicines can increase your risk for breathing problems. Follow instructions from your health care provider about wearing your sleep device: Anytime you are sleeping, including during daytime naps. While taking prescription pain medicines, sleeping medicines, or medicines that make you drowsy. Return to your normal activities as told by your health care provider. Ask your health care provider what activities are safe for you. Take over-the-counter and prescription medicines only as told by your health care provider. Do not use any products that contain nicotine or tobacco. These products include cigarettes, chewing tobacco, and vaping devices, such as e-cigarettes. These can delay incision healing after surgery. If you need help quitting, ask your health care provider. Contact a health care provider if: You have nausea or vomiting that does not get better with medicine. You vomit every time you eat or drink. You have pain that does not get better with medicine. You cannot urinate or have bloody urine. You develop a skin rash. You have a fever. Get help right away if: You have trouble breathing. You have chest pain. You vomit blood. These symptoms may be an emergency. Get help  right away. Call 911. Do not wait to see if the symptoms will go away. Do not drive yourself to the hospital. Summary After the procedure, it is common to have a sore throat, hoarseness, nausea, vomiting, or to feel weak, sleepy, or fatigue. For the time period you were told by your health care provider, do not drive or use machinery. Get help right away if you have difficulty breathing, have chest pain, or vomit blood. These symptoms may be an emergency. This information is not intended to replace advice given to you by your health care provider. Make sure you discuss any questions you have with your health care provider. Document Revised: 01/20/2022 Document Reviewed: 01/20/2022 Elsevier Patient Education  2024 [employer].

## 2024-07-25 ENCOUNTER — Encounter: Payer: Self-pay | Admitting: Pharmacist

## 2024-07-25 ENCOUNTER — Other Ambulatory Visit

## 2024-07-25 ENCOUNTER — Ambulatory Visit: Payer: Self-pay | Admitting: Gastroenterology

## 2024-07-25 DIAGNOSIS — R7303 Prediabetes: Secondary | ICD-10-CM

## 2024-07-25 NOTE — Progress Notes (Signed)
 07/25/2024 Name: Barbara Hinton MRN: [id number] DOB: [birth year]  Chief Complaint  Patient presents with   Prediabetes    Barbara Hinton is a 53 y.o. year old female who presented for a telephone visit.   They were referred to the pharmacist by their PCP for assistance in managing prediabetes.    Subjective:  Care Team: Primary Care Provider: Lavell Bari LABOR, FNP   Medication Access/Adherence  Current Pharmacy:  [facility] [phone] - [place], [place] - [address] [place] [address] Phone: [phone] Fax: [phone]   PRE-Diabetes/MASH:  Current medications: none Following with GI for MASH Medications tried in the past: n/a  Current glucose readings:  Up to 150-160 FBG  BMI 54.38 Interested in starting a GLP1 for prediabetes, MASH and obesity  Current meal patterns:  The patient is asked to make an attempt to improve diet and exercise patterns to aid in medical management of this problem. Discussed meal planning options and Plate method for healthy eating Avoid sugary drinks and desserts Incorporate balanced protein, non starchy veggies, 1 serving of carbohydrate with each meal Increase water intake Increase physical activity as able  Objective:  Lab Results  Component Value Date   HGBA1C 5.8 (H) 04/28/2024    Lab Results  Component Value Date   CREATININE 0.84 07/03/2024   BUN 11 07/03/2024   NA 140 07/03/2024   K 4.5 07/03/2024   CL 103 07/03/2024   CO2 22 07/03/2024    Lab Results  Component Value Date   CHOL 126 07/03/2024   HDL 41 06/18/2024   LDLCALC 57 06/18/2024   TRIG 107 07/03/2024   CHOLHDL 2.9 06/18/2024    Medications Reviewed Today     Reviewed by Billee Mliss BIRCH, [facility] (Pharmacist) on 07/29/24 at 2234  Med List Status: <None>   Medication Order Taking? Sig Documenting Provider Last Dose Status Informant  albuterol  (VENTOLIN  HFA) 108 (90 Base) MCG/ACT inhaler [id number] No INHALE 2 PUFFS INTO  THE LUNGS EVERY 6 HOURS AS NEEDED FOR WHEEZE OR SHORTNESS OF BREATH Hawks, Christy A, FNP More than a month Active   aspirin 81 MG tablet [id number] No Take 81 mg by mouth 2 (two) times a week. [provider] Past Week Active   atorvastatin  (LIPITOR) 10 MG tablet [id number] No Take 1 tablet (10 mg total) by mouth daily. Shlomo Wilbert SAUNDERS, MD 07/26/2024 Active   betamethasone  dipropionate 0.05 % cream [id number] No Apply topically 2 (two) times daily. Lavell Bari LABOR, [place] 07/28/2024 Morning Active   Blood Glucose Monitoring Suppl DEVI [id number] No 1 each by Does not apply route in the morning, at noon, and at bedtime. May substitute to any manufacturer covered by patient's insurance. Lavell Bari A, FNP Unknown Active   calcipotriene  (DOVONOX) 0.005 % ointment [id number] No Apply topically 2 (two) times daily. Lavell Bari LABOR, FNP Past Week Active   famotidine (PEPCID) 20 MG tablet [id number] No Take 20 mg by mouth 2 (two) times daily. [provider] 07/26/2024 Active   fluticasone -salmeterol [facility] INHUB) 100-50 MCG/ACT AEPB [id number] No INHALE 1 PUFF INTO THE LUNGS TWICE A DAY Lavell Bari LABOR, FNP 07/27/2024 Active   glucose blood (ACCU-CHEK GUIDE TEST) test strip [id number] No Test BS in the morning, noon and at bedtime Dx R73.03 Lavell Bari LABOR, FNP Unknown Active   meclizine (ANTIVERT) 12.5 MG tablet [id number] No Take 12.5 mg by mouth 3 (three) times daily as needed for dizziness (as needed onset of dizzy). [provider] More than a month Active   metoprolol  succinate (TOPROL  XL) 25 MG 24 hr tablet [id number]  Take 0.5 tablets (12.5 mg total) by mouth daily. Shlomo Wilbert SAUNDERS, MD  Active            Med Note DOREAN, LINDSAY E   Mon Jul 28, 2024  8:48 AM) Patient states she no longer takes med.             Assessment/Plan:   PRE-Diabetes/MASH: - fasting blood sugars appear to rising, will recheck A1c, GI following closely. Heavy emphasis on diet and lifestyle  changes - Reviewed long term cardiovascular and renal outcomes of uncontrolled blood sugar., Reviewed dietary modifications including healthy plate method., and Reviewed lifestyle modifications including increased physical activity. - Consider Wegovy  for MASH (also BMI 54.38, prediabetes), but unsure if this would be covered based on [employer]rosc[employer]-F0.  Message sent to GI MD to discuss.  She also notes the potential for Rezdiffra  Follow Up Plan: 3 weeks  Mliss Tarry Griffin, PharmD, BCACP, CPP Clinical Pharmacist, [facility]

## 2024-07-28 ENCOUNTER — Ambulatory Visit (HOSPITAL_BASED_OUTPATIENT_CLINIC_OR_DEPARTMENT_OTHER): Admitting: Certified Registered"

## 2024-07-28 ENCOUNTER — Encounter (HOSPITAL_COMMUNITY): Payer: Self-pay | Admitting: Internal Medicine

## 2024-07-28 ENCOUNTER — Encounter (HOSPITAL_COMMUNITY)
Admission: RE | Disposition: A | Discharge status: Home / Self Care | Payer: Self-pay | Source: Home / Self Care | Attending: Internal Medicine

## 2024-07-28 ENCOUNTER — Ambulatory Visit (HOSPITAL_COMMUNITY)
Admission: RE | Admit: 2024-07-28 | Discharge: 2024-07-28 | Disposition: A | Discharge status: Home / Self Care | Attending: Internal Medicine | Admitting: Internal Medicine

## 2024-07-28 ENCOUNTER — Ambulatory Visit (HOSPITAL_COMMUNITY): Admitting: Certified Registered"

## 2024-07-28 ENCOUNTER — Other Ambulatory Visit: Payer: Self-pay | Admitting: *Deleted

## 2024-07-28 DIAGNOSIS — E6689 Other obesity not elsewhere classified: Secondary | ICD-10-CM | POA: Diagnosis not present

## 2024-07-28 DIAGNOSIS — K222 Esophageal obstruction: Secondary | ICD-10-CM | POA: Diagnosis not present

## 2024-07-28 DIAGNOSIS — J45909 Unspecified asthma, uncomplicated: Secondary | ICD-10-CM | POA: Diagnosis not present

## 2024-07-28 DIAGNOSIS — R7989 Other specified abnormal findings of blood chemistry: Secondary | ICD-10-CM

## 2024-07-28 DIAGNOSIS — G4733 Obstructive sleep apnea (adult) (pediatric): Secondary | ICD-10-CM | POA: Insufficient documentation

## 2024-07-28 DIAGNOSIS — Z6841 Body Mass Index (BMI) 40.0 and over, adult: Secondary | ICD-10-CM | POA: Insufficient documentation

## 2024-07-28 DIAGNOSIS — R1310 Dysphagia, unspecified: Secondary | ICD-10-CM | POA: Insufficient documentation

## 2024-07-28 DIAGNOSIS — K219 Gastro-esophageal reflux disease without esophagitis: Secondary | ICD-10-CM | POA: Diagnosis not present

## 2024-07-28 DIAGNOSIS — K449 Diaphragmatic hernia without obstruction or gangrene: Secondary | ICD-10-CM | POA: Diagnosis not present

## 2024-07-28 DIAGNOSIS — K5730 Diverticulosis of large intestine without perforation or abscess without bleeding: Secondary | ICD-10-CM | POA: Diagnosis not present

## 2024-07-28 DIAGNOSIS — K3189 Other diseases of stomach and duodenum: Secondary | ICD-10-CM | POA: Diagnosis not present

## 2024-07-28 DIAGNOSIS — K648 Other hemorrhoids: Secondary | ICD-10-CM | POA: Diagnosis not present

## 2024-07-28 DIAGNOSIS — Z1211 Encounter for screening for malignant neoplasm of colon: Secondary | ICD-10-CM | POA: Insufficient documentation

## 2024-07-28 DIAGNOSIS — Z01818 Encounter for other preprocedural examination: Secondary | ICD-10-CM

## 2024-07-28 DIAGNOSIS — K2970 Gastritis, unspecified, without bleeding: Secondary | ICD-10-CM | POA: Insufficient documentation

## 2024-07-28 HISTORY — PX: COLONOSCOPY: SHX5424

## 2024-07-28 HISTORY — PX: ESOPHAGOGASTRODUODENOSCOPY: SHX5428

## 2024-07-28 HISTORY — PX: ESOPHAGEAL DILATION: SHX303

## 2024-07-28 LAB — GLUCOSE, CAPILLARY: Glucose-Capillary: 107 mg/dL — ABNORMAL HIGH (ref 70–99)

## 2024-07-28 SURGERY — COLONOSCOPY
Anesthesia: General

## 2024-07-28 MED ORDER — LIDOCAINE HCL (CARDIAC) PF 100 MG/5ML IV SOSY
PREFILLED_SYRINGE | INTRAVENOUS | Status: DC | PRN
  Administered 2024-07-28: 50 mg via INTRAVENOUS

## 2024-07-28 MED ORDER — PROPOFOL 10 MG/ML IV BOLUS
INTRAVENOUS | Status: DC | PRN
  Administered 2024-07-28 (×3): 50 mg via INTRAVENOUS
  Administered 2024-07-28: 100 mg via INTRAVENOUS
  Administered 2024-07-28 (×4): 50 mg via INTRAVENOUS

## 2024-07-28 MED ORDER — LACTATED RINGERS IV SOLN: INTRAVENOUS | Status: DC

## 2024-07-28 MED ORDER — PHENYLEPHRINE HCL (PRESSORS) 10 MG/ML IV SOLN
INTRAVENOUS | Status: DC | PRN
  Administered 2024-07-28: 100 ug via INTRAVENOUS

## 2024-07-28 NOTE — Transfer of Care (Signed)
 Immediate Anesthesia Transfer of Care Note  Patient: [facility]  Procedure(s) Performed: COLONOSCOPY EGD (ESOPHAGOGASTRODUODENOSCOPY) DILATION, ESOPHAGUS  Patient Location: Short Stay  Anesthesia Type:General  Level of Consciousness: awake, alert , oriented, and patient cooperative  Airway & Oxygen Therapy: Patient Spontanous Breathing  Post-op Assessment: Report given to RN and Post -op Vital signs reviewed and stable  Post vital signs: Reviewed and stable  Last Vitals:  Vitals Value Taken Time  BP 114/76   Temp 98.9   Pulse 68   Resp 16   SpO2 96     Last Pain:  Vitals:   07/28/24 1006  PainSc: 0-No pain         Complications: No notable events documented.

## 2024-07-28 NOTE — Op Note (Signed)
 [facility] Patient Name: Barbara Hinton Procedure Date: 07/28/2024 9:53 AM MRN: [id number] Date of Birth: [birth year] Attending MD: Carlin POUR. Cindie , [place], [id number] CSN: [id number] Age: 53 Admit Type: Outpatient Procedure:                Colonoscopy Indications:              Screening for colorectal malignant neoplasm Providers:                Carlin POUR. Cindie, DO, [place], [place],                            Technician Referring MD:              Medicines:                See the Anesthesia note for documentation of the                            administered medications Complications:            No immediate complications. Estimated Blood Loss:     Estimated blood loss: none. Procedure:                Pre-Anesthesia Assessment:                           - The anesthesia plan was to use monitored                            anesthesia care (MAC).                           After obtaining informed consent, the colonoscope                            was passed under direct vision. Throughout the                            procedure, the patient's blood pressure, pulse, and                            oxygen saturations were monitored continuously. The                            [id number]-HQ190L ([id number]) Peds Colon was introduced                            through the anus and advanced to the the cecum,                            identified by appendiceal orifice and ileocecal                            valve. The colonoscopy was performed without                            difficulty. The patient tolerated the procedure  well. The quality of the bowel preparation was                            evaluated using the BBPS [facility] Bowel Preparation                            Scale) with scores of: Right Colon = 3, Transverse                            Colon = 3 and Left Colon = 3 (entire mucosa seen                            well with no residual staining,  small fragments of                            stool or opaque liquid). The total BBPS score                            equals 9. Scope In: 10:21:23 AM Scope Out: 10:30:56 AM Scope Withdrawal Time: 0 hours 7 minutes 55 seconds  Total Procedure Duration: 0 hours 9 minutes 33 seconds  Findings:      Non-bleeding internal hemorrhoids were found.      The exam was otherwise without abnormality. Impression:               - Non-bleeding internal hemorrhoids.                           - The examination was otherwise normal.                           - No specimens collected. Moderate Sedation:      Per Anesthesia Care Recommendation:           - Patient has a contact number available for                            emergencies. The signs and symptoms of potential                            delayed complications were discussed with the                            patient. Return to normal activities tomorrow.                            Written discharge instructions were provided to the                            patient.                           - Resume previous diet.                           - Continue present medications.                           -  Repeat colonoscopy in 10 years for screening                            purposes.                           - Return to GI clinic in 4 months. Procedure Code(s):        --- Professional ---                           H9878, Colorectal cancer screening; colonoscopy on                            individual not meeting criteria for high risk Diagnosis Code(s):        --- Professional ---                           Z12.11, Encounter for screening for malignant                            neoplasm of colon                           K64.8, Other hemorrhoids CPT copyright 2022 American Medical Association. All rights reserved. The codes documented in this report are preliminary and upon coder review may  be revised to meet current compliance  requirements. Carlin POUR. Cindie, DO Carlin POUR. Cindie, DO 07/28/2024 10:32:46 AM This report has been signed electronically. Number of Addenda: 0

## 2024-07-28 NOTE — Op Note (Addendum)
 [facility] Patient Name: Barbara Hinton Procedure Date: 07/28/2024 9:58 AM MRN: [id number] Date of Birth: 1971/02/07 Attending MD: Carlin POUR. Cindie , [place], [id number] CSN: [id number] Age: 53 Admit Type: Outpatient Procedure:                Upper GI endoscopy Indications:              Dysphagia, Heartburn Providers:                Carlin POUR. Cindie, DO, [place], [place] Wilson,                            Technician Referring MD:              Medicines:                See the Anesthesia note for documentation of the                            administered medications Complications:            No immediate complications. Estimated Blood Loss:     Estimated blood loss was minimal. Procedure:                Pre-Anesthesia Assessment:                           - The anesthesia plan was to use monitored                            anesthesia care (MAC).                           After obtaining informed consent, the endoscope was                            passed under direct vision. Throughout the                            procedure, the patient's blood pressure, pulse, and                            oxygen saturations were monitored continuously. The                            [id number]-[id number] ([id number]) Upper was introduced through                            the mouth, and advanced to the second part of                            duodenum. The upper GI endoscopy was accomplished                            without difficulty. The patient tolerated the                            procedure well. Scope In: 10:10:49 AM Scope  Out: 10:15:21 AM Total Procedure Duration: 0 hours 4 minutes 32 seconds  Findings:      A small hiatal hernia was present.      A mild Schatzki ring was found in the distal esophagus. A TTS dilator       was passed through the scope. Dilation with an 18-19-20 mm balloon       dilator was performed to 20 mm. The dilation site was examined and       showed mild improvement  in luminal narrowing.      Patchy mild inflammation characterized by erythema was found in the       entire examined stomach. Biopsies were taken with a cold forceps for       Helicobacter pylori testing.      The duodenal bulb, first portion of the duodenum and second portion of       the duodenum were normal. Impression:               - Small hiatal hernia.                           - Mild Schatzki ring. Dilated.                           - Gastritis. Biopsied.                           - Normal duodenal bulb, first portion of the                            duodenum and second portion of the duodenum. Moderate Sedation:      Per Anesthesia Care Recommendation:           - Patient has a contact number available for                            emergencies. The signs and symptoms of potential                            delayed complications were discussed with the                            patient. Return to normal activities tomorrow.                            Written discharge instructions were provided to the                            patient.                           - Resume previous diet.                           - Continue present medications.                           - Await pathology results.                           -  Repeat upper endoscopy PRN for retreatment.                           - Return to GI clinic in 3 months. Procedure Code(s):        --- Professional ---                           [phone], Esophagogastroduodenoscopy, flexible,                            transoral; with transendoscopic balloon dilation of                            esophagus (less than 30 mm diameter)                           43239, 59, Esophagogastroduodenoscopy, flexible,                            transoral; with biopsy, single or multiple Diagnosis Code(s):        --- Professional ---                           K44.9, Diaphragmatic hernia without obstruction or                             gangrene                           K22.2, Esophageal obstruction                           K29.70, Gastritis, unspecified, without bleeding                           R13.10, Dysphagia, unspecified                           R12, Heartburn CPT copyright 2022 American Medical Association. All rights reserved. The codes documented in this report are preliminary and upon coder review may  be revised to meet current compliance requirements. Carlin POUR. Cindie, DO Carlin POUR. Cindie, DO 07/28/2024 10:17:53 AM This report has been signed electronically. Number of Addenda: 0

## 2024-07-28 NOTE — Discharge Instructions (Addendum)
 EGD Discharge instructions Please read the instructions outlined below and refer to this sheet in the next few weeks. These discharge instructions provide you with general information on caring for yourself after you leave the hospital. Your doctor may also give you specific instructions. While your treatment has been planned according to the most current medical practices available, unavoidable complications occasionally occur. If you have any problems or questions after discharge, please call your doctor. ACTIVITY You may resume your regular activity but move at a slower pace for the next 24 hours.  Take frequent rest periods for the next 24 hours.  Walking will help expel (get rid of) the air and reduce the bloated feeling in your abdomen.  No driving for 24 hours (because of the anesthesia (medicine) used during the test).  You may shower.  Do not sign any important legal documents or operate any machinery for 24 hours (because of the anesthesia used during the test).  NUTRITION Drink plenty of fluids.  You may resume your normal diet.  Begin with a light meal and progress to your normal diet.  Avoid alcoholic beverages for 24 hours or as instructed by your caregiver.  MEDICATIONS You may resume your normal medications unless your caregiver tells you otherwise.  WHAT YOU CAN EXPECT TODAY You may experience abdominal discomfort such as a feeling of fullness or "gas" pains.  FOLLOW-UP Your doctor will discuss the results of your test with you.  SEEK IMMEDIATE MEDICAL ATTENTION IF ANY OF THE FOLLOWING OCCUR: Excessive nausea (feeling sick to your stomach) and/or vomiting.  Severe abdominal pain and distention (swelling).  Trouble swallowing.  Temperature over 101 F (37.8 C).  Rectal bleeding or vomiting of blood.      Colonoscopy Discharge Instructions  Read the instructions outlined below and refer to this sheet in the next few weeks. These discharge instructions provide you  with general information on caring for yourself after you leave the hospital. Your doctor may also give you specific instructions. While your treatment has been planned according to the most current medical practices available, unavoidable complications occasionally occur.   ACTIVITY You may resume your regular activity, but move at a slower pace for the next 24 hours.  Take frequent rest periods for the next 24 hours.  Walking will help get rid of the air and reduce the bloated feeling in your belly (abdomen).  No driving for 24 hours (because of the medicine (anesthesia) used during the test).   Do not sign any important legal documents or operate any machinery for 24 hours (because of the anesthesia used during the test).  NUTRITION Drink plenty of fluids.  You may resume your normal diet as instructed by your doctor.  Begin with a light meal and progress to your normal diet. Heavy or fried foods are harder to digest and may make you feel sick to your stomach (nauseated).  Avoid alcoholic beverages for 24 hours or as instructed.  MEDICATIONS You may resume your normal medications unless your doctor tells you otherwise.  WHAT YOU CAN EXPECT TODAY Some feelings of bloating in the abdomen.  Passage of more gas than usual.  Spotting of blood in your stool or on the toilet paper.  IF YOU HAD POLYPS REMOVED DURING THE COLONOSCOPY: No aspirin products for 7 days or as instructed.  No alcohol for 7 days or as instructed.  Eat a soft diet for the next 24 hours.  FINDING OUT THE RESULTS OF YOUR TEST Not all test results  are available during your visit. If your test results are not back during the visit, make an appointment with your caregiver to find out the results. Do not assume everything is normal if you have not heard from your caregiver or the medical facility. It is important for you to follow up on all of your test results.  SEEK IMMEDIATE MEDICAL ATTENTION IF: You have more than a  spotting of blood in your stool.  Your belly is swollen (abdominal distention).  You are nauseated or vomiting.  You have a temperature over 101.  You have abdominal pain or discomfort that is severe or gets worse throughout the day.   Your EGD revealed mild amount inflammation in your stomach.  I took biopsies of this to rule out infection with a bacteria called H. pylori.  Await pathology results, my office will contact you.  You also had a tightening of your esophagus called a Schatzki's ring.  This is a benign ring related to acid reflux.  I stretched this out today.  Hopefully this helps with feeling of food getting stuck.  Your colonoscopy was relatively unremarkable.  I did not find any polyps or evidence of colon cancer.  I recommend repeating colonoscopy in 10 years for colon cancer screening purposes.    Follow-up with GI in 3-4 months   I hope you have a great rest of your week!  Barbara Hinton. Barbara Hinton, D.O. Gastroenterology and Hepatology [facility]

## 2024-07-28 NOTE — Progress Notes (Signed)
 Patient unable to void for urine pregnancy test.  Attempted several times.  Dr. Kendell notified.  He states ok to not get.  Patient states her last period was April 79974 and she has not had sex in over a year, patient states:  I know I'm not pregnant.

## 2024-07-28 NOTE — Anesthesia Preprocedure Evaluation (Signed)
 Anesthesia Evaluation  Patient identified by MRN, date of birth, ID band Patient awake    Reviewed: Allergy & Precautions, H&P , NPO status , Patient's Chart, lab work & pertinent test results, reviewed documented beta blocker date and time   Airway Mallampati: II  TM Distance: >3 FB Neck ROM: full    Dental no notable dental hx.    Pulmonary neg pulmonary ROS, asthma , sleep apnea    Pulmonary exam normal breath sounds clear to auscultation       Cardiovascular Exercise Tolerance: Good hypertension, negative cardio ROS  Rhythm:regular Rate:Normal     Neuro/Psych  Neuromuscular disease negative neurological ROS  negative psych ROS   GI/Hepatic negative GI ROS, Neg liver ROS,,,  Endo/Other    Class 4 obesity  Renal/GU negative Renal ROS  negative genitourinary   Musculoskeletal   Abdominal   Peds  Hematology negative hematology ROS (+)   Anesthesia Other Findings   Reproductive/Obstetrics negative OB ROS                              Anesthesia Physical Anesthesia Plan  ASA: 3  Anesthesia Plan: General   Post-op Pain Management:    Induction:   PONV Risk Score and Plan: Propofol  infusion  Airway Management Planned:   Additional Equipment:   Intra-op Plan:   Post-operative Plan:   Informed Consent: I have reviewed the patients History and Physical, chart, labs and discussed the procedure including the risks, benefits and alternatives for the proposed anesthesia with the patient or authorized representative who has indicated his/her understanding and acceptance.     Dental Advisory Given  Plan Discussed with: CRNA  Anesthesia Plan Comments:         Anesthesia Quick Evaluation

## 2024-07-28 NOTE — Interval H&P Note (Signed)
 History and Physical Interval Note:  07/28/2024 9:59 AM  Barbara Hinton  has presented today for surgery, with the diagnosis of SCREENING COLON CANCER,DYSPHAGIA,GERD.  The various methods of treatment have been discussed with the patient and family. After consideration of risks, benefits and other options for treatment, the patient has consented to  Procedure(s) with comments: COLONOSCOPY (N/A) - 9:45 AM, ASA 3 EGD (ESOPHAGOGASTRODUODENOSCOPY) (N/A) DILATION, ESOPHAGUS (N/A) as a surgical intervention.  The patient's history has been reviewed, patient examined, no change in status, stable for surgery.  I have reviewed the patient's chart and labs.  Questions were answered to the patient's satisfaction.     [name]

## 2024-07-29 ENCOUNTER — Encounter (HOSPITAL_COMMUNITY): Payer: Self-pay | Admitting: Internal Medicine

## 2024-07-29 ENCOUNTER — Encounter: Payer: Self-pay | Admitting: Pharmacist

## 2024-07-29 LAB — SURGICAL PATHOLOGY

## 2024-07-31 ENCOUNTER — Other Ambulatory Visit

## 2024-08-01 ENCOUNTER — Telehealth: Payer: Self-pay | Admitting: Pharmacist

## 2024-08-01 NOTE — Anesthesia Postprocedure Evaluation (Signed)
 Anesthesia Post Note  Patient: Kameko Hukill [facility]  Procedure(s) Performed: COLONOSCOPY EGD (ESOPHAGOGASTRODUODENOSCOPY) DILATION, ESOPHAGUS  Patient location during evaluation: Phase II Anesthesia Type: General Level of consciousness: awake Pain management: pain level controlled Vital Signs Assessment: post-procedure vital signs reviewed and stable Respiratory status: spontaneous breathing and respiratory function stable Cardiovascular status: blood pressure returned to baseline and stable Postop Assessment: no headache and no apparent nausea or vomiting Anesthetic complications: no Comments: Late entry   No notable events documented.   Last Vitals:  Vitals:   07/28/24 1035 07/28/24 1041  BP: (!) 87/52 99/62  Pulse: 89   Resp: 20   Temp: 37.2 C   SpO2: 98%     Last Pain:  Vitals:   07/28/24 1035  TempSrc: Oral  PainSc: 0-No pain                 Yvonna JINNY Bosworth

## 2024-08-01 NOTE — Telephone Encounter (Signed)
 Seeking approval for Wegovy  for MASH (liver indication) DX: K76 & K75.81  Can you please see where/who I need to submit this too? Cover my meds doesn't make sense to me

## 2024-08-04 ENCOUNTER — Telehealth: Payer: Self-pay | Admitting: Pharmacy Technician

## 2024-08-04 ENCOUNTER — Other Ambulatory Visit (HOSPITAL_COMMUNITY): Payer: Self-pay

## 2024-08-04 NOTE — Telephone Encounter (Signed)
 Pharmacy Patient Advocate Encounter   Received notification from Pt Calls Messages that prior authorization for Wegovy  0.25mg /0.52ml auto-injectors is required/requested.   Insurance verification completed.   The patient is insured through [facility] .   Per test claim: PA required; PA submitted to above mentioned insurance via Prompt PA Key/confirmation #/EOC [id number] Status is pending

## 2024-08-04 NOTE — Telephone Encounter (Signed)
 PA request has been Submitted. New Encounter has been or will be created for follow up. For additional info see Pharmacy Prior Auth telephone encounter from 08/04/2024.

## 2024-08-05 ENCOUNTER — Other Ambulatory Visit (HOSPITAL_COMMUNITY): Payer: Self-pay

## 2024-08-06 ENCOUNTER — Other Ambulatory Visit (HOSPITAL_COMMUNITY): Payer: Self-pay

## 2024-08-06 NOTE — Telephone Encounter (Signed)
 Pharmacy Patient Advocate Encounter  Received notification from [employer] that Prior Authorization for Wegovy  0.25mg /0.37ml auto-injectors has been DENIED.  Full denial letter will be uploaded to the media tab. See denial reason below.   PA #/Case ID/Reference #: [id number]

## 2024-08-07 NOTE — Telephone Encounter (Signed)
 Wegovy denied:

## 2024-08-07 NOTE — Telephone Encounter (Signed)
 I called and spoke with patient and made her aware. She voiced understanding.

## 2024-08-08 ENCOUNTER — Other Ambulatory Visit: Payer: Self-pay | Admitting: Family

## 2024-08-08 DIAGNOSIS — J45909 Unspecified asthma, uncomplicated: Secondary | ICD-10-CM

## 2024-08-28 ENCOUNTER — Encounter: Payer: Self-pay | Admitting: Family

## 2024-08-28 ENCOUNTER — Ambulatory Visit: Admitting: Family

## 2024-08-28 VITALS — BP 113/70 | HR 81 | Temp 97.0°F | Ht 64.0 in | Wt 313.4 lb

## 2024-08-28 DIAGNOSIS — M25551 Pain in right hip: Secondary | ICD-10-CM

## 2024-08-28 DIAGNOSIS — R7303 Prediabetes: Secondary | ICD-10-CM | POA: Diagnosis not present

## 2024-08-28 DIAGNOSIS — J45909 Unspecified asthma, uncomplicated: Secondary | ICD-10-CM

## 2024-08-28 DIAGNOSIS — Z0001 Encounter for general adult medical examination with abnormal findings: Secondary | ICD-10-CM | POA: Diagnosis not present

## 2024-08-28 DIAGNOSIS — G4733 Obstructive sleep apnea (adult) (pediatric): Secondary | ICD-10-CM | POA: Diagnosis not present

## 2024-08-28 DIAGNOSIS — E559 Vitamin D deficiency, unspecified: Secondary | ICD-10-CM

## 2024-08-28 DIAGNOSIS — K2100 Gastro-esophageal reflux disease with esophagitis, without bleeding: Secondary | ICD-10-CM

## 2024-08-28 DIAGNOSIS — K760 Fatty (change of) liver, not elsewhere classified: Secondary | ICD-10-CM

## 2024-08-28 DIAGNOSIS — Z0000 Encounter for general adult medical examination without abnormal findings: Secondary | ICD-10-CM

## 2024-08-28 LAB — BAYER DCA HB A1C WAIVED: HB A1C (BAYER DCA - WAIVED): 5.6 % (ref 4.8–5.6)

## 2024-08-28 MED ORDER — DICLOFENAC SODIUM 75 MG PO TBEC: 75.0000 mg | DELAYED_RELEASE_TABLET | Freq: Two times a day (BID) | ORAL | 2 refills | Status: DC

## 2024-08-28 NOTE — Patient Instructions (Signed)
 Hip Pain The hip is the joint between the upper legs and the lower pelvis. The bones, cartilage, tendons, and muscles of your hip joint support your body and allow you to move around. Hip pain can range from a minor ache to severe pain in one or both of your hips. The pain may be felt on the inside of the hip joint near the groin, or on the outside near the buttocks and upper thigh. You may also have swelling or stiffness in your hip area. Follow these instructions at home: Managing pain, stiffness, and swelling     If told, put ice on the painful area. Put ice in a plastic bag. Place a towel between your skin and the bag. Leave the ice on for 20 minutes, 2-3 times a day. If told, apply heat to the affected area as often as told by your health care provider. Use the heat source that your provider recommends, such as a moist heat pack or a heating pad. Place a towel between your skin and the heat source. Leave the heat on for 20-30 minutes. If your skin turns bright red, remove the ice or heat right away to prevent skin damage. The risk of damage is higher if you cannot feel pain, heat, or cold. Activity Do exercises as told by your provider. Avoid activities that cause pain. General instructions  Take over-the-counter and prescription medicines only as told by your provider. Keep a journal of your symptoms. Write down: How often you have hip pain. The location of your pain. What the pain feels like. What makes the pain worse. Sleep with a pillow between your legs on your most comfortable side. Keep all follow-up visits. Your provider will monitor your pain and activity. Contact a health care provider if: You cannot put weight on your leg. Your pain or swelling gets worse after a week. It gets harder to walk. You have a fever. Get help right away if: You fall. You have a sudden increase in pain and swelling in your hip. Your hip is red or swollen or very tender to touch. This  information is not intended to replace advice given to you by your health care provider. Make sure you discuss any questions you have with your health care provider. Document Revised: 06/27/2022 Document Reviewed: 06/27/2022 Elsevier Patient Education  2024 [employer].

## 2024-08-28 NOTE — Progress Notes (Signed)
 Subjective:    Patient ID: Barbara Hinton, female    DOB: 1971/02/03, 53 y.o.   MRN: [id number]  Chief Complaint  Patient presents with   Medical Management of Chronic Issues   Pt presents to the office today for CPE.   She is morbid obese with a BMI of 53.    She is followed by Cardiologists for palpitations and PVC.  She was have dizziness and had CT coronary on 04/17/24 that showed, 1. Coronary artery calcium  score 0.5 Agatston units. This places the patient in the 83rd percentile for age and gender, suggesting high risk for future cardiac events.   2.  No obstructive CAD.   She has an appointment for ENT next week for dizziness.  She has OSA and uses CPAP.  This is stable.   She is followed by GI for GERD.   Complaining of right hip pain since her colonoscopy. Reports pain with rotation.  Gastroesophageal Reflux She complains of belching, coughing (some times), early satiety, heartburn and wheezing. This is a chronic problem. The current episode started more than 1 year ago. The problem occurs occasionally. The symptoms are aggravated by certain foods. Risk factors include obesity. She has tried a histamine-2 antagonist for the symptoms. The treatment provided moderate relief.  Asthma She complains of cough (some times), shortness of breath and wheezing. This is a chronic problem. The current episode started more than 1 year ago. The problem occurs intermittently. Associated symptoms include heartburn. Her symptoms are aggravated by exercise. Her symptoms are alleviated by rest. Her symptoms are not alleviated by rest. Her past medical history is significant for asthma.  Rash This is a chronic problem. The current episode started more than 1 year ago. The problem has been waxing and waning since onset. The affected locations include the right lower leg (right lower leg). The rash is characterized by itchiness and dryness. She was exposed to nothing. Associated symptoms  include coughing (some times) and shortness of breath. Past treatments include anti-itch cream, antihistamine and oral steroids. The treatment provided mild relief. Her past medical history is significant for asthma.  Dizziness This is a chronic problem. The current episode started more than 1 month ago. The problem occurs intermittently. The problem has been waxing and waning. Associated symptoms include coughing (some times) and a rash. The symptoms are aggravated by twisting and bending. She has tried rest for the symptoms. The treatment provided mild relief.  Diabetes Diabetes type: prediabetic. Hypoglycemia symptoms include dizziness. Pertinent negatives for diabetes include no blurred vision and no foot paresthesias. Symptoms are stable.      Review of Systems  Eyes:  Negative for blurred vision.  Respiratory:  Positive for cough (some times), shortness of breath and wheezing.   Gastrointestinal:  Positive for heartburn.  Skin:  Positive for rash.  Neurological:  Positive for dizziness.  All other systems reviewed and are negative.   Social History   Socioeconomic History   Marital status: Married    Spouse name: Not on file   Number of children: Not on file   Years of education: Not on file   Highest education level: 12th grade  Occupational History   Not on file  Tobacco Use   Smoking status: Never    Passive exposure: Yes   Smokeless tobacco: Never  Substance and Sexual Activity   Alcohol use: No   Drug use: No   Sexual activity: Not on file  Other Topics Concern   Not on  file  Social History Narrative   Not on file   Social Drivers of Health   Financial Resource Strain: Patient Declined (04/25/2024)   Overall Financial Resource Strain (CARDIA)    Difficulty of Paying Living Expenses: Patient declined  Food Insecurity: Patient Declined (04/25/2024)   Hunger Vital Sign    Worried About Running Out of Food in the Last Year: Patient declined    Ran Out of  Food in the Last Year: Patient declined  Transportation Needs: No Transportation Needs (04/25/2024)   PRAPARE - Administrator, Civil Service (Medical): No    Lack of Transportation (Non-Medical): No  Physical Activity: Inactive (04/25/2024)   Exercise Vital Sign    Days of Exercise per Week: 1 day    Minutes of Exercise per Session: 0 min  Stress: Stress Concern Present (04/25/2024)   [employer] of Occupational Health - Occupational Stress Questionnaire    Feeling of Stress: To some extent  Social Connections: Unknown (04/25/2024)   Social Connection and Isolation Panel    Frequency of Communication with Friends and Family: Patient declined    Frequency of Social Gatherings with Friends and Family: More than three times a week    Attends Religious Services: 1 to 4 times per year    Active Member of [employer] or Organizations: Patient declined    Attends Engineer, structural: Not on file    Marital Status: Married   Family History  Problem Relation Age of Onset   Diabetes Mother    Diabetes Father    Hypertension Father    Cirrhosis Brother        liver transplant   Hypertension Brother    Heart attack Maternal Grandfather    Heart attack Paternal Grandfather    Colon cancer Neg Hx    Colon polyps Neg Hx         Objective:   Physical Exam Vitals reviewed.  Constitutional:      General: She is not in acute distress.    Appearance: She is well-developed. She is obese.  HENT:     Head: Normocephalic and atraumatic.     Right Ear: Tympanic membrane normal.     Left Ear: A middle ear effusion is present.  Eyes:     Pupils: Pupils are equal, round, and reactive to light.  Neck:     Thyroid : No thyromegaly.  Cardiovascular:     Rate and Rhythm: Normal rate and regular rhythm.     Heart sounds: Normal heart sounds. No murmur heard. Pulmonary:     Effort: Pulmonary effort is normal. No respiratory distress.     Breath sounds: No wheezing.  Abdominal:      General: Bowel sounds are normal. There is no distension.     Palpations: Abdomen is soft.     Tenderness: There is no abdominal tenderness.  Musculoskeletal:        General: Tenderness present. Normal range of motion.     Cervical back: Normal range of motion and neck supple.     Comments: Pain in right hip with internal and external rotation  Skin:    General: Skin is warm and dry.     Findings: No erythema.         Comments: Dry silver rash on right lower leg that is approx 9X10 cm  Neurological:     Mental Status: She is alert and oriented to person, place, and time.     Cranial Nerves: No cranial nerve  deficit.     Deep Tendon Reflexes: Reflexes are normal and symmetric.  Psychiatric:        Behavior: Behavior normal.        Thought Content: Thought content normal.        Judgment: Judgment normal.       BP 113/70   Pulse 81   Temp (!) 97 F (36.1 C) (Temporal)   Ht 5' 4 (1.626 m)   Wt (!) 313 lb 6.4 oz (142.2 kg)   SpO2 93%   BMI 53.79 kg/m      Assessment & Plan:  ITZAYANNA KASTER comes in today with chief complaint of Medical Management of Chronic Issues   Diagnosis and orders addressed:  1. Annual physical exam (Primary) - Bayer DCA Hb A1c Waived - CBC with Differential/Platelet - Lipid panel - CMP14+EGFR - VITAMIN D  25 Hydroxy (Vit-D Deficiency, Fractures) - TSH  2. Vitamin D  deficiency - CBC with Differential/Platelet - CMP14+EGFR - VITAMIN D  25 Hydroxy (Vit-D Deficiency, Fractures)  3. Prediabetes - Bayer DCA Hb A1c Waived - CBC with Differential/Platelet - CMP14+EGFR - TSH  4. OSA (obstructive sleep apnea) - CBC with Differential/Platelet - CMP14+EGFR  5. Mild asthma without complication, unspecified whether persistent  - CBC with Differential/Platelet - CMP14+EGFR  6. Fatty liver - CBC with Differential/Platelet - CMP14+EGFR  7. Gastroesophageal reflux disease with esophagitis without hemorrhage  - CBC with  Differential/Platelet - CMP14+EGFR  8. Obesity, morbid (HCC) - CBC with Differential/Platelet - CMP14+EGFR  9. Right hip pain Rest Ice  ROM exercises  Start diclofenac  BID with food, no other NSAID's - CBC with Differential/Platelet - CMP14+EGFR - diclofenac  (VOLTAREN ) 75 MG EC tablet; Take 1 tablet (75 mg total) by mouth 2 (two) times daily.  Dispense: 60 tablet; Refill: 2   Labs pending Keep ENT appt Continue betamethasone  and calcipotriene   for psoriasis. Avoid scratching.  Avoid caffeine  Start diclofenac  BID for hip pain, ROM exercises encouraged.  Continue current medications  Keep follow up with specialists  Health Maintenance reviewed Diet and exercise encouraged  No follow-ups on file.    Bari Learn, FNP

## 2024-08-29 ENCOUNTER — Ambulatory Visit: Payer: Self-pay | Admitting: Family

## 2024-08-29 LAB — CBC WITH DIFFERENTIAL/PLATELET
Basophils Absolute: 0 x10E3/uL (ref 0.0–0.2)
Basos: 0 %
EOS (ABSOLUTE): 0.2 x10E3/uL (ref 0.0–0.4)
Eos: 3 %
Hematocrit: 44 % (ref 34.0–46.6)
Hemoglobin: 14.6 g/dL (ref 11.1–15.9)
Immature Grans (Abs): 0 x10E3/uL (ref 0.0–0.1)
Immature Granulocytes: 0 %
Lymphocytes Absolute: 2.5 x10E3/uL (ref 0.7–3.1)
Lymphs: 37 %
MCH: 29.4 pg (ref 26.6–33.0)
MCHC: 33.2 g/dL (ref 31.5–35.7)
MCV: 89 fL (ref 79–97)
Monocytes Absolute: 0.5 x10E3/uL (ref 0.1–0.9)
Monocytes: 7 %
Neutrophils Absolute: 3.5 x10E3/uL (ref 1.4–7.0)
Neutrophils: 53 %
Platelets: 279 x10E3/uL (ref 150–450)
RBC: 4.96 x10E6/uL (ref 3.77–5.28)
RDW: 12.5 % (ref 11.7–15.4)
WBC: 6.7 x10E3/uL (ref 3.4–10.8)

## 2024-08-29 LAB — CMP14+EGFR
ALT: 33 IU/L — ABNORMAL HIGH (ref 0–32)
AST: 36 IU/L (ref 0–40)
Albumin: 3.9 g/dL (ref 3.8–4.9)
Alkaline Phosphatase: 104 IU/L (ref 49–135)
BUN/Creatinine Ratio: 8 — ABNORMAL LOW (ref 9–23)
BUN: 7 mg/dL (ref 6–24)
Bilirubin Total: 0.9 mg/dL (ref 0.0–1.2)
CO2: 24 mmol/L (ref 20–29)
Calcium: 9.4 mg/dL (ref 8.7–10.2)
Chloride: 104 mmol/L (ref 96–106)
Creatinine, Ser: 0.83 mg/dL (ref 0.57–1.00)
Globulin, Total: 3 g/dL (ref 1.5–4.5)
Glucose: 115 mg/dL — ABNORMAL HIGH (ref 70–99)
Potassium: 4.3 mmol/L (ref 3.5–5.2)
Sodium: 141 mmol/L (ref 134–144)
Total Protein: 6.9 g/dL (ref 6.0–8.5)
eGFR: 85 mL/min/1.73 (ref >59)

## 2024-08-29 LAB — LIPID PANEL
Chol/HDL Ratio: 2.3 ratio (ref 0.0–4.4)
Cholesterol, Total: 122 mg/dL (ref 100–199)
HDL: 52 mg/dL (ref >39)
LDL Chol Calc (NIH): 53 mg/dL (ref 0–99)
Triglycerides: 88 mg/dL (ref 0–149)
VLDL Cholesterol Cal: 17 mg/dL (ref 5–40)

## 2024-08-29 LAB — VITAMIN D 25 HYDROXY (VIT D DEFICIENCY, FRACTURES): Vit D, 25-Hydroxy: 30.5 ng/mL (ref 30.0–100.0)

## 2024-08-29 LAB — TSH: TSH: 2.24 u[IU]/mL (ref 0.450–4.500)

## 2024-09-03 ENCOUNTER — Institutional Professional Consult (permissible substitution) (INDEPENDENT_AMBULATORY_CARE_PROVIDER_SITE_OTHER): Admitting: Otolaryngology

## 2024-09-03 ENCOUNTER — Ambulatory Visit (INDEPENDENT_AMBULATORY_CARE_PROVIDER_SITE_OTHER): Admitting: Otolaryngology

## 2024-09-03 ENCOUNTER — Encounter (INDEPENDENT_AMBULATORY_CARE_PROVIDER_SITE_OTHER): Payer: Self-pay | Admitting: Otolaryngology

## 2024-09-03 VITALS — BP 135/80 | HR 71 | Wt 311.0 lb

## 2024-09-03 DIAGNOSIS — H608X9 Other otitis externa, unspecified ear: Secondary | ICD-10-CM

## 2024-09-03 DIAGNOSIS — R42 Dizziness and giddiness: Secondary | ICD-10-CM

## 2024-09-03 DIAGNOSIS — H608X2 Other otitis externa, left ear: Secondary | ICD-10-CM

## 2024-09-03 NOTE — Progress Notes (Signed)
 Reason for Consult:dizziness Referring Physician: Dr Lavell Ellouise [name] is an 52 y.o. female.  HPI: She is here for evaluation of dizziness.  She has had this since May.  It started out as a sensation of swimmy feeling.  It usually happened in the car.  It would last for just a few minutes or less.  She had a number of episodes in the beginning.  She now is much better.  She has had significant workup cardiology wise since then.  The dizziness started when she started a new medication.  They took her off of that medication and the feeling improved.  She has not had any of the episodes of dizzy feeling in the last month.  She has no hearing loss or tinnitus now or even during the episodes.  No fullness in the ear.  She does complain about a little bit of itching in the left ear.  She has not tried any medications for that.  No headaches.  Past Medical History:  Diagnosis Date   Agatston coronary artery calcium  score less than 100 04/2024   cor cal score 0.5   Asthma    OSA (obstructive sleep apnea) 12/04/2017   Mild with AHI overall 9.8/hr but during REM sleep severe with AHI 53.8/hr and nocturnal hypoxemia now on CPAP at 10cm H2O.     PVC (premature ventricular contraction)     Past Surgical History:  Procedure Laterality Date   CESAREAN SECTION     COLONOSCOPY N/A 07/28/2024   Procedure: COLONOSCOPY;  Surgeon: Cindie Carlin POUR, DO;  Location: [facility];  Service: Endoscopy;  Laterality: N/A;  9:45 AM, ASA 3   ESOPHAGEAL DILATION N/A 07/28/2024   Procedure: DILATION, ESOPHAGUS;  Surgeon: Cindie Carlin POUR, DO;  Location: [facility];  Service: Endoscopy;  Laterality: N/A;   ESOPHAGOGASTRODUODENOSCOPY N/A 07/28/2024   Procedure: EGD (ESOPHAGOGASTRODUODENOSCOPY);  Surgeon: Cindie Carlin POUR, DO;  Location: [facility];  Service: Endoscopy;  Laterality: N/A;    Family History  Problem Relation Age of Onset   Diabetes Mother    Diabetes Father    Hypertension Father    Cirrhosis  Brother        liver transplant   Hypertension Brother    Heart attack Maternal Grandfather    Heart attack Paternal Grandfather    Colon cancer Neg Hx    Colon polyps Neg Hx     Social History:  reports that she has never smoked. She has been exposed to tobacco smoke. She has never used smokeless tobacco. She reports that she does not drink alcohol and does not use drugs.  Allergies: No Known Allergies  Medications: I have reviewed the patient's current medications.  No results found for this or any previous visit (from the past 48 hours).  No results found.  ROS Blood pressure 135/80, pulse 71, weight (!) 311 lb (141.1 kg), SpO2 94%. Physical Exam Constitutional:      Appearance: Normal appearance.  HENT:     Head: Normocephalic and atraumatic.     Right Ear: Tympanic membrane is without lesions and middle ear aerated, ear canal and external ear normal.     Left Ear: Tympanic membrane is without lesions and middle ear aerated, ear canal and external ear normal.     Nose: Nose normal. Turbinates with mild hypertrophy, No significant swelling or masses.     Oral cavity/oropharynx: Mucous membranes are moist. No lesions or masses    Larynx: normal voice. Mirror attempted  without success    Eyes:     Extraocular Movements: Extraocular movements intact.     Conjunctiva/sclera: Conjunctivae normal.     Pupils: Pupils are equal, round, and reactive to light.  Cardiovascular:     Rate and Rhythm: Normal rate.  Pulmonary:     Effort: Pulmonary effort is normal.  Musculoskeletal:     Cervical back: Normal range of motion and neck supple. No rigidity.  Lymphadenopathy:     Cervical: No cervical adenopathy or masses.salivary glands without lesions. .  Neurological:     Mental Status: He is alert. CN 2-12 intact. No nystagmus      Assessment/Plan: Dizziness/otitis externa-this does not sound like a otologic vestibular problem.  She likely had this dizziness secondary to her  medications as that is when it started and stopped afterwards.  She has not had an episode in over a month.  Since she has no hearing loss or tinnitus I do not think it is necessary to proceed with an audiogram and with the dizziness gone not any need for an MRI right now.  She will use some cortisone cream for the left ear for about a week and see if that takes care of her itching.  She will follow-up as needed.  Barbara Hinton Notice 09/03/2024, 10:22 AM

## 2024-09-27 ENCOUNTER — Other Ambulatory Visit: Payer: Self-pay | Admitting: Family

## 2024-09-27 DIAGNOSIS — J45909 Unspecified asthma, uncomplicated: Secondary | ICD-10-CM

## 2024-09-27 DIAGNOSIS — L409 Psoriasis, unspecified: Secondary | ICD-10-CM

## 2024-09-30 ENCOUNTER — Encounter: Payer: Self-pay | Admitting: Internal Medicine

## 2024-10-07 ENCOUNTER — Telehealth: Payer: Self-pay | Admitting: Pharmacist

## 2024-10-10 ENCOUNTER — Encounter: Payer: Self-pay | Admitting: Cardiology

## 2024-10-15 ENCOUNTER — Telehealth: Payer: Self-pay

## 2024-10-15 MED ORDER — METOPROLOL SUCCINATE ER 25 MG PO TB24: 12.5000 mg | ORAL_TABLET | Freq: Every day | ORAL | 3 refills | Status: AC

## 2024-10-15 NOTE — Telephone Encounter (Signed)
 Toprol  ordered per Dr. Shlomo, [facility] to patient.

## 2024-10-16 ENCOUNTER — Other Ambulatory Visit: Payer: Self-pay | Admitting: *Deleted

## 2024-10-16 DIAGNOSIS — R7989 Other specified abnormal findings of blood chemistry: Secondary | ICD-10-CM

## 2024-10-24 ENCOUNTER — Other Ambulatory Visit: Payer: Self-pay | Admitting: Family

## 2024-10-24 DIAGNOSIS — J45909 Unspecified asthma, uncomplicated: Secondary | ICD-10-CM

## 2024-11-03 ENCOUNTER — Ambulatory Visit (INDEPENDENT_AMBULATORY_CARE_PROVIDER_SITE_OTHER): Payer: Self-pay | Admitting: Family

## 2024-11-03 VITALS — BP 109/72 | HR 80 | Temp 97.9°F | Ht 64.0 in | Wt 317.6 lb

## 2024-11-03 DIAGNOSIS — K2100 Gastro-esophageal reflux disease with esophagitis, without bleeding: Secondary | ICD-10-CM

## 2024-11-03 DIAGNOSIS — L409 Psoriasis, unspecified: Secondary | ICD-10-CM

## 2024-11-03 DIAGNOSIS — K760 Fatty (change of) liver, not elsewhere classified: Secondary | ICD-10-CM

## 2024-11-03 DIAGNOSIS — L570 Actinic keratosis: Secondary | ICD-10-CM

## 2024-11-03 DIAGNOSIS — Z1159 Encounter for screening for other viral diseases: Secondary | ICD-10-CM

## 2024-11-03 DIAGNOSIS — G4733 Obstructive sleep apnea (adult) (pediatric): Secondary | ICD-10-CM | POA: Diagnosis not present

## 2024-11-03 DIAGNOSIS — J45909 Unspecified asthma, uncomplicated: Secondary | ICD-10-CM

## 2024-11-03 DIAGNOSIS — E559 Vitamin D deficiency, unspecified: Secondary | ICD-10-CM | POA: Diagnosis not present

## 2024-11-03 DIAGNOSIS — R7303 Prediabetes: Secondary | ICD-10-CM

## 2024-11-03 MED ORDER — CALCIPOTRIENE 0.005 % EX OINT: TOPICAL_OINTMENT | Freq: Two times a day (BID) | CUTANEOUS | 3 refills | Status: AC

## 2024-11-03 MED ORDER — ALBUTEROL SULFATE HFA 108 (90 BASE) MCG/ACT IN AERS: 1.0000 | INHALATION_SPRAY | Freq: Four times a day (QID) | RESPIRATORY_TRACT | 2 refills | Status: AC | PRN

## 2024-11-03 MED ORDER — BETAMETHASONE DIPROPIONATE 0.05 % EX CREA: TOPICAL_CREAM | Freq: Two times a day (BID) | CUTANEOUS | 3 refills | Status: AC

## 2024-11-03 NOTE — Progress Notes (Signed)
 "  Subjective:    Patient ID: Barbara Hinton, female    DOB: January 04, 1971, 53 y.o.   MRN: [id number]  Chief Complaint  Patient presents with   Annual Exam   Pt presents to the office today for chronic follow up.   She is morbid obese with a BMI of 54.    She is followed by Cardiologists for palpitations and PVC.  She was have dizziness and had CT coronary on 04/17/24 that showed, 1. Coronary artery calcium  score 0.5 Agatston units. This places the patient in the 83rd percentile for age and gender, suggesting high risk for future cardiac events.   2.  No obstructive CAD.   She has an appointment for ENT next week for dizziness.  She has OSA and uses CPAP.  This is stable.   She is followed by GI for GERD.   Complaining of a skin lesion on neck that she noticed several months ago.  Gastroesophageal Reflux She complains of belching, early satiety, heartburn and wheezing. This is a chronic problem. The current episode started more than 1 year ago. The problem occurs occasionally. The symptoms are aggravated by certain foods. Risk factors include obesity. She has tried a histamine-2 antagonist for the symptoms. The treatment provided moderate relief.  Asthma She complains of shortness of breath and wheezing. This is a chronic problem. The current episode started more than 1 year ago. The problem occurs intermittently. Associated symptoms include heartburn. Her symptoms are aggravated by exercise. Her symptoms are alleviated by rest. Her symptoms are not alleviated by rest. Her past medical history is significant for asthma.  Rash This is a chronic problem. The current episode started more than 1 year ago. The problem has been waxing and waning since onset. The affected locations include the right lower leg (right lower leg). The rash is characterized by itchiness and dryness. She was exposed to nothing. Associated symptoms include shortness of breath. Past treatments include anti-itch cream,  antihistamine and oral steroids. The treatment provided mild relief. Her past medical history is significant for asthma.  Diabetes Diabetes type: prediabetic. Pertinent negatives for diabetes include no blurred vision and no foot paresthesias. Symptoms are stable.      Review of Systems  Eyes:  Negative for blurred vision.  Respiratory:  Positive for shortness of breath and wheezing.   Gastrointestinal:  Positive for heartburn.  Skin:  Positive for rash.  All other systems reviewed and are negative.   Social History   Socioeconomic History   Marital status: Married    Spouse name: Not on file   Number of children: Not on file   Years of education: Not on file   Highest education level: 12th grade  Occupational History   Not on file  Tobacco Use   Smoking status: Never    Passive exposure: Yes   Smokeless tobacco: Never  Substance and Sexual Activity   Alcohol use: No   Drug use: No   Sexual activity: Not on file  Other Topics Concern   Not on file  Social History Narrative   Not on file   Social Drivers of Health   Tobacco Use: Medium Risk (11/03/2024)   Patient History    Smoking Tobacco Use: Never    Smokeless Tobacco Use: Never    Passive Exposure: Yes  Financial Resource Strain: Patient Declined (11/03/2024)   Overall Financial Resource Strain (CARDIA)    Difficulty of Paying Living Expenses: Patient declined  Food Insecurity: Patient Declined (11/03/2024)  Epic    Worried About Programme Researcher, Broadcasting/film/video in the Last Year: Patient declined    Barista in the Last Year: Patient declined  Transportation Needs: Patient Declined (11/03/2024)   Epic    Lack of Transportation (Medical): Patient declined    Lack of Transportation (Non-Medical): Patient declined  Physical Activity: Insufficiently Active (11/03/2024)   Exercise Vital Sign    Days of Exercise per Week: 5 days    Minutes of Exercise per Session: 10 min  Stress: Patient Declined (11/03/2024)    [employer] of Occupational Health - Occupational Stress Questionnaire    Feeling of Stress: Patient declined  Social Connections: Socially Integrated (11/03/2024)   Social Connection and Isolation Panel    Frequency of Communication with Friends and Family: More than three times a week    Frequency of Social Gatherings with Friends and Family: Three times a week    Attends Religious Services: More than 4 times per year    Active Member of Clubs or Organizations: Yes    Attends Banker Meetings: More than 4 times per year    Marital Status: Married  Depression (PHQ2-9): Low Risk (11/03/2024)   Depression (PHQ2-9)    PHQ-2 Score: 0  Recent Concern: Depression (PHQ2-9) - Medium Risk (08/28/2024)   Depression (PHQ2-9)    PHQ-2 Score: 6  Alcohol Screen: Not on file  Housing: Unknown (11/03/2024)   Epic    Unable to Pay for Housing in the Last Year: Patient declined    Number of Times Moved in the Last Year: Not on file    Homeless in the Last Year: No  Utilities: Not on file  Health Literacy: Not on file   Family History  Problem Relation Age of Onset   Diabetes Mother    Diabetes Father    Hypertension Father    Cirrhosis Brother        liver transplant   Hypertension Brother    Heart attack Maternal Grandfather    Heart attack Paternal Grandfather    Colon cancer Neg Hx    Colon polyps Neg Hx         Objective:   Physical Exam Vitals reviewed.  Constitutional:      General: She is not in acute distress.    Appearance: She is well-developed. She is obese.  HENT:     Head: Normocephalic and atraumatic.     Right Ear: Tympanic membrane normal.     Left Ear: A middle ear effusion is present.  Eyes:     Pupils: Pupils are equal, round, and reactive to light.  Neck:     Thyroid : No thyromegaly.  Cardiovascular:     Rate and Rhythm: Normal rate and regular rhythm.     Heart sounds: Normal heart sounds. No murmur heard. Pulmonary:     Effort:  Pulmonary effort is normal. No respiratory distress.     Breath sounds: No wheezing.  Abdominal:     General: Bowel sounds are normal. There is no distension.     Palpations: Abdomen is soft.     Tenderness: There is no abdominal tenderness.  Musculoskeletal:        General: Tenderness present. Normal range of motion.     Cervical back: Normal range of motion and neck supple.     Comments: Pain in right hip with internal and external rotation  Skin:    General: Skin is warm and dry.     Findings: Lesion present.  No erythema.         Comments: Dry silver rash on right lower leg that is approx 9X10 cm Small oval scaly, dry lesion on posterior left shoulder  Neurological:     Mental Status: She is alert and oriented to person, place, and time.     Cranial Nerves: No cranial nerve deficit.     Deep Tendon Reflexes: Reflexes are normal and symmetric.  Psychiatric:        Behavior: Behavior normal.        Thought Content: Thought content normal.        Judgment: Judgment normal.     Cryotherapy used on skin on lesion on left shoulder. Pt tolerated well.   BP 109/72   Pulse 80   Temp 97.9 F (36.6 C) (Temporal)   Ht 5' 4 (1.626 m)   Wt (!) 317 lb 9.6 oz (144.1 kg)   SpO2 93%   BMI 54.52 kg/m      Assessment & Plan:  Barbara Hinton comes in today with chief complaint of Annual Exam   Diagnosis and orders addressed:  1. Mild asthma without complication, unspecified whether persistent - albuterol  (VENTOLIN  HFA) 108 (90 Base) MCG/ACT inhaler; Inhale 1-2 puffs into the lungs every 6 (six) hours as needed for wheezing or shortness of breath.  Dispense: 6.7 each; Refill: 2 - CMP14+EGFR - CBC with Differential/Platelet  2. Psoriasis - betamethasone  dipropionate 0.05 % cream; Apply topically 2 (two) times daily.  Dispense: 60 g; Refill: 3 - calcipotriene  (DOVONOX) 0.005 % ointment; Apply topically 2 (two) times daily.  Dispense: 60 g; Refill: 3 - CMP14+EGFR - CBC with  Differential/Platelet  3. Fatty liver - CMP14+EGFR - CBC with Differential/Platelet  4. Gastroesophageal reflux disease with esophagitis without hemorrhage  - CMP14+EGFR - CBC with Differential/Platelet  5. Obesity, morbid (HCC)  - CMP14+EGFR - CBC with Differential/Platelet  6. OSA (obstructive sleep apnea) (Primary) - CMP14+EGFR - CBC with Differential/Platelet  7. Prediabetes  - CMP14+EGFR - CBC with Differential/Platelet  8. Vitamin D  deficiency  - CMP14+EGFR - CBC with Differential/Platelet  9. Need for hepatitis B screening test - CMP14+EGFR - CBC with Differential/Platelet - Hepatitis B surface antibody,quantitative  10. Actinic keratosis Avoid picking   Labs pending Continue betamethasone  and calcipotriene   for psoriasis. Avoid scratching.  Avoid caffeine  Continue current medications  Keep follow up with specialists  Health Maintenance reviewed Diet and exercise encouraged  Return in about 6 months (around 05/04/2025), or if symptoms worsen or fail to improve.    Bari Learn, FNP    "

## 2024-11-03 NOTE — Patient Instructions (Signed)

## 2024-11-04 LAB — CMP14+EGFR
ALT: 32 IU/L (ref 0–32)
AST: 36 IU/L (ref 0–40)
Albumin: 4 g/dL (ref 3.8–4.9)
Alkaline Phosphatase: 100 IU/L (ref 49–135)
BUN/Creatinine Ratio: 11 (ref 9–23)
BUN: 10 mg/dL (ref 6–24)
Bilirubin Total: 0.9 mg/dL (ref 0.0–1.2)
CO2: 22 mmol/L (ref 20–29)
Calcium: 9.7 mg/dL (ref 8.7–10.2)
Chloride: 104 mmol/L (ref 96–106)
Creatinine, Ser: 0.88 mg/dL (ref 0.57–1.00)
Globulin, Total: 2.7 g/dL (ref 1.5–4.5)
Glucose: 138 mg/dL — AB (ref 70–99)
Potassium: 4.2 mmol/L (ref 3.5–5.2)
Sodium: 141 mmol/L (ref 134–144)
Total Protein: 6.7 g/dL (ref 6.0–8.5)
eGFR: 80 mL/min/1.73

## 2024-11-04 LAB — CBC WITH DIFFERENTIAL/PLATELET
Basophils Absolute: 0 x10E3/uL (ref 0.0–0.2)
Basos: 0 %
EOS (ABSOLUTE): 0.2 x10E3/uL (ref 0.0–0.4)
Eos: 2 %
Hematocrit: 44.9 % (ref 34.0–46.6)
Hemoglobin: 14.2 g/dL (ref 11.1–15.9)
Immature Grans (Abs): 0 x10E3/uL (ref 0.0–0.1)
Immature Granulocytes: 0 %
Lymphocytes Absolute: 2.1 x10E3/uL (ref 0.7–3.1)
Lymphs: 31 %
MCH: 28.6 pg (ref 26.6–33.0)
MCHC: 31.6 g/dL (ref 31.5–35.7)
MCV: 91 fL (ref 79–97)
Monocytes Absolute: 0.4 x10E3/uL (ref 0.1–0.9)
Monocytes: 7 %
Neutrophils Absolute: 4 x10E3/uL (ref 1.4–7.0)
Neutrophils: 60 %
Platelets: 261 x10E3/uL (ref 150–450)
RBC: 4.96 x10E6/uL (ref 3.77–5.28)
RDW: 12.9 % (ref 11.7–15.4)
WBC: 6.7 x10E3/uL (ref 3.4–10.8)

## 2024-11-04 LAB — HEPATITIS B SURFACE ANTIBODY, QUANTITATIVE: Hepatitis B Surf Ab Quant: <3.5 m[IU]/mL — ABNORMAL LOW

## 2024-11-04 LAB — ENHANCED LIVER FIBROSIS (ELF): ELF(TM) Score: 10.61 — ABNORMAL HIGH

## 2024-11-09 ENCOUNTER — Ambulatory Visit: Payer: Self-pay | Admitting: Gastroenterology

## 2024-11-13 ENCOUNTER — Ambulatory Visit: Payer: Self-pay | Admitting: Family

## 2024-11-14 ENCOUNTER — Other Ambulatory Visit: Payer: Self-pay | Admitting: Gastroenterology

## 2024-11-14 DIAGNOSIS — K7581 Nonalcoholic steatohepatitis (NASH): Secondary | ICD-10-CM

## 2024-11-14 DIAGNOSIS — K760 Fatty (change of) liver, not elsewhere classified: Secondary | ICD-10-CM

## 2024-11-14 MED ORDER — REZDIFFRA 100 MG PO TABS: 100.0000 mg | ORAL_TABLET | Freq: Every day | ORAL | 11 refills | Status: AC

## 2024-11-19 NOTE — Telephone Encounter (Signed)
 Started to do PA on Cover My Meds, but there was a form that needed to be filled out. Waiting on them to fax it to me.

## 2024-11-20 ENCOUNTER — Telehealth: Payer: Self-pay

## 2024-11-20 NOTE — Telephone Encounter (Signed)
 PA done for Rezdiffra  on Cover My Meds. Dx used: K21.9 (GERD). Tried/failed: Omeprazole , pantoprazole , protonix  and Tums, Gas-X. Pt's office notes were faxed as well. [employer] sent back a additional document that needs to be filled out and sent back.

## 2024-11-23 ENCOUNTER — Other Ambulatory Visit: Payer: Self-pay | Admitting: Family

## 2024-11-23 DIAGNOSIS — M25551 Pain in right hip: Secondary | ICD-10-CM

## 2024-12-03 NOTE — Telephone Encounter (Signed)
Faxed the forms.

## 2024-12-09 ENCOUNTER — Telehealth: Payer: Self-pay | Admitting: *Deleted

## 2024-12-10 ENCOUNTER — Encounter: Payer: Self-pay | Admitting: Gastroenterology

## 2024-12-10 NOTE — Telephone Encounter (Signed)
 Noted. Faxed appeal letter again

## 2025-05-05 ENCOUNTER — Ambulatory Visit: Admitting: Family
